# Patient Record
Sex: Female | Born: 1975 | Race: White | Hispanic: No | Marital: Married | Smoking: Former smoker
Health system: Southern US, Community
[De-identification: ages and names within clinical notes are randomized; demographics above are authoritative.]

## PROBLEM LIST (undated history)

## (undated) DIAGNOSIS — C801 Malignant (primary) neoplasm, unspecified: Secondary | ICD-10-CM

## (undated) DIAGNOSIS — T4145XA Adverse effect of unspecified anesthetic, initial encounter: Secondary | ICD-10-CM

## (undated) DIAGNOSIS — F419 Anxiety disorder, unspecified: Secondary | ICD-10-CM

## (undated) DIAGNOSIS — T8859XA Other complications of anesthesia, initial encounter: Secondary | ICD-10-CM

## (undated) DIAGNOSIS — I10 Essential (primary) hypertension: Secondary | ICD-10-CM

## (undated) HISTORY — PX: ABDOMINAL HYSTERECTOMY: SHX81

---

## 2010-10-01 HISTORY — PX: CHOLECYSTECTOMY: SHX55

## 2013-11-07 ENCOUNTER — Ambulatory Visit: Payer: Self-pay | Admitting: Family Medicine

## 2013-11-07 LAB — OCCULT BLOOD X 1 CARD TO LAB, STOOL: OCCULT BLOOD, FECES: POSITIVE

## 2015-03-07 ENCOUNTER — Ambulatory Visit
Admission: EM | Admit: 2015-03-07 | Discharge: 2015-03-07 | Disposition: A | Discharge status: Home / Self Care | Payer: BC Managed Care – PPO | Attending: Family Medicine | Admitting: Family Medicine

## 2015-03-07 DIAGNOSIS — M5442 Lumbago with sciatica, left side: Secondary | ICD-10-CM | POA: Diagnosis not present

## 2015-03-07 HISTORY — DX: Essential (primary) hypertension: I10

## 2015-03-07 HISTORY — DX: Anxiety disorder, unspecified: F41.9

## 2015-03-07 MED ORDER — KETOROLAC TROMETHAMINE 60 MG/2ML IM SOLN
60.0000 mg | Freq: Once | INTRAMUSCULAR | Status: AC
  Administered 2015-03-07: 60 mg via INTRAMUSCULAR

## 2015-03-07 MED ORDER — CYCLOBENZAPRINE HCL 10 MG PO TABS: 10.0000 mg | ORAL_TABLET | Freq: Every day | ORAL | Status: DC

## 2015-03-07 MED ORDER — HYDROCODONE-ACETAMINOPHEN 5-325 MG PO TABS: 1.0000 | ORAL_TABLET | Freq: Four times a day (QID) | ORAL | Status: DC | PRN

## 2015-03-07 NOTE — Discharge Instructions (Signed)
Back Pain, Adult °Back pain is very common. The pain often gets better over time. The cause of back pain is usually not dangerous. Most people can learn to manage their back pain on their own.  °HOME CARE  °· Stay active. Start with short walks on flat ground if you can. Try to walk farther each day. °· Do not sit, drive, or stand in one place for more than 30 minutes. Do not stay in bed. °· Do not avoid exercise or work. Activity can help your back heal faster. °· Be careful when you bend or lift an object. Bend at your knees, keep the object close to you, and do not twist. °· Sleep on a firm mattress. Lie on your side, and bend your knees. If you lie on your back, put a pillow under your knees. °· Only take medicines as told by your doctor. °· Put ice on the injured area. °¨ Put ice in a plastic bag. °¨ Place a towel between your skin and the bag. °¨ Leave the ice on for 15-20 minutes, 03-04 times a day for the first 2 to 3 days. After that, you can switch between ice and heat packs. °· Ask your doctor about back exercises or massage. °· Avoid feeling anxious or stressed. Find good ways to deal with stress, such as exercise. °GET HELP RIGHT AWAY IF:  °· Your pain does not go away with rest or medicine. °· Your pain does not go away in 1 week. °· You have new problems. °· You do not feel well. °· The pain spreads into your legs. °· You cannot control when you poop (bowel movement) or pee (urinate). °· Your arms or legs feel weak or lose feeling (numbness). °· You feel sick to your stomach (nauseous) or throw up (vomit). °· You have belly (abdominal) pain. °· You feel like you may pass out (faint). °MAKE SURE YOU:  °· Understand these instructions. °· Will watch your condition. °· Will get help right away if you are not doing well or get worse. °Document Released: 03/05/2008 Document Revised: 12/10/2011 Document Reviewed: 01/19/2014 °ExitCare® Patient Information ©2015 [employer]. This information is not intended  to replace advice given to you by your health care provider. Make sure you discuss any questions you have with your health care provider. ° °

## 2015-03-07 NOTE — ED Notes (Signed)
Since yesterday after loading dishwasher. Pt reports the pain is located in the thoracic spine radiating down to the gluteal region bilaterally, and is relieved by heat compress. Pt has also tried Ibuprofen, with no relief. Pt had an old prescription of Hydrocodone, which helped the pain moderately.

## 2015-03-08 ENCOUNTER — Encounter: Payer: Self-pay | Admitting: Physician Assistant

## 2015-03-08 NOTE — ED Provider Notes (Signed)
CSN: [id number]     Arrival date & time 03/07/15  1715 History   First MD Initiated Contact with Patient 03/07/15 1802     Chief Complaint  Patient presents with  . Back Pain   (Consider location/radiation/quality/duration/timing/severity/associated sxs/prior Treatment) Patient is a 39 y.o. female presenting with back pain.  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 Denies fall or trauma. Works at [employer]. Aching low back is common. Was helping in household yesterday and noted low back discomfort after loading the dishwasher. Took lorazepam and ibuprofen and went to bed. After a few hour nap noted continued discomfort and took another lorazepam and a "leftover 2014 hydrocodone from previous Rx, last one" . At bedtime took additional lorazepam an tylenol, now using a heating pad-spent end of day sitting on soft sofa. Walking and flat on firm surface or more comfortable. Changing position most difficult. Initially pain bilateral but localized left sided with some radiation into left buttocks. No paresthesia, no pain below buttock, no difficulty with voiding or defecating except pain rising from the toilet. Managed self care today and ambulated unassisted into suite.  Past Medical History  Diagnosis Date  . Hypertension   . Anxiety    Past Surgical History  Procedure Laterality Date  . Cholecystectomy  2012   Family History  Problem Relation Age of Onset  . Diabetes Father    History  Substance Use Topics  . Smoking status: Former Smoker -- 0.25 packs/day for 5 years    Types: Cigarettes    Quit date: 03/01/2005  . Smokeless tobacco: Not on file  . Alcohol Use: Yes     Comment: occasionally   OB History    Gravida Para Term Preterm AB TAB SAB Ectopic Multiple Living   1 1             Review of Systems  Constitutional: Negative.    HENT: Negative.   Eyes: Negative.   Respiratory: Negative for shortness of breath.   Cardiovascular: Negative for chest pain, palpitations and leg swelling.  Gastrointestinal: Negative.   Endocrine: Negative.   Genitourinary: Negative.  Negative for flank pain.  Musculoskeletal: Positive for back pain. Negative for neck pain.  Skin: Negative.   Allergic/Immunologic: Negative.   Neurological: Negative for dizziness, weakness and numbness.  Hematological: Negative.   Psychiatric/Behavioral: The patient is nervous/anxious.   All other systems reviewed and are negative.     Allergies  Lisinopril and Ciprofloxacin  Home Medications   Prior to Admission medications   Medication Sig Start Date End Date Taking? Authorizing Provider  ferrous sulfate 325 (65 FE) MG tablet Take 325 mg by mouth 2 (two) times daily with a meal.   Yes Historical Provider, MD  hydrochlorothiazide (HYDRODIURIL) 25 MG tablet Take 25 mg by mouth daily.   Yes Historical Provider, MD  LORazepam (ATIVAN) 0.5 MG tablet Take 0.5 mg by mouth every 8 (eight) hours.   Yes Historical Provider, MD  sertraline (ZOLOFT) 25 MG tablet Take 50 mg by mouth daily.   Yes Historical Provider, MD  cyclobenzaprine (FLEXERIL) 10 MG tablet Take 1 tablet (10 mg total) by mouth at bedtime. 03/07/15   Jan Fireman, PA-C  HYDROcodone-acetaminophen (NORCO/VICODIN) 5-325 MG per tablet Take 1 tablet by mouth every 6 (six) hours as needed. 03/07/15   Jan Fireman,  PA-C   BP 148/89 mmHg  Pulse 77  Temp(Src) 98.2 F (36.8 C)  Resp 16  Ht 5\' 7"  (1.702 m)  Wt 324 lb (146.965 kg)  BMI 50.73 kg/m2  SpO2 100%  LMP 02/20/2015 (Exact Date) Physical Exam Constitutional -alert and oriented,morbidy obese female reporting low back pain. Husband present Head-atraumatic Eyes- conjunctiva normal, EOMI ,conjugate gaze\ Ears -negative Nose- no congestion or rhinorrhea Mouth/throat- mucous membranes moist , Neck- supple CV- regular rate, grossly normal  heart sounds, good peripheral circulation Resp-no distress, normal respiratory effort,clear to auscultation bilaterally GI- soft,non-tender,no distention GU-  not examined Back- no tenderness along the spine or para-spinous areas; midline low lumbar low grade aching, right sciatic notch palpation can recreate right discomfort experienced yesterday- palpation of right notch is acutely uncomfortable to site and  left buttocks only. Able to climb on and off table - SLR testing is negative bilaterally. DTRs equal 2+ MSK- no lower extremity tenderness nor edema,no joint effusion, ambulatory Neuro- normal speech and language, no gross focal neurological deficit appreciated, antalgic gait Skin-warm,dry ,intact; no rash noted Psych-mood and affect grossly normal; speech and behavior grossly normal ED Course  Procedures (including critical care time) Labs Review Labs Reviewed - No data to display Medications  ketorolac (TORADOL) injection 60 mg (60 mg Intramuscular Given 03/07/15 1835)   Well tolerated and with significant relief from discomfort. Ice pack was placed on low back as she rested waiting out Rx observation, also helpful Imaging Review No results found.   1. Left-sided low back pain with left-sided sciatica     Strongly discouraged the use of outdated Rx in future. Firm support--floor or firm sofa back. Not soft mattress or soft chairs without arms. Ice pack/frozen peas Limit schedule tomorrow. Body mechanics, avoid twist/lift reviewed. Discharge Medication List as of 03/07/2015  7:14 PM    START taking these medications   Details  cyclobenzaprine (FLEXERIL) 10 MG tablet Take 1 tablet (10 mg total) by mouth at bedtime., Starting 03/07/2015, Until Discontinued, Print    HYDROcodone-acetaminophen (NORCO/VICODIN) 5-325 MG per tablet Take 1 tablet by mouth every 6 (six) hours as needed., Starting 03/07/2015, Until Discontinued, Print      Meds prescribed for HS use unless out of work and  at home- no driving-reviewed with couple. Weight issues obviously addressed before-encourage uses of back exercises given in discharge after the acute process has resolved. To RTC with any paresthesia, extension of discomfort into lower leg or elsewhere, inability to void or defecate or control those functions.  Plan: 1. diagnosis reviewed with patient and husband 2. rx as per orders; risks, benefits, potential side effects reviewed with patient 3. Recommend supportive treatment with ice paks/frozen peas,body mechanics 4. F/u prn if symptoms worsen or don't improve 5. Questions fielded, expectations and recommendations reviewed. Patient expresses understanding. Will return to [facility] with questions, concern or exacerbation.     Jan Fireman, PA-C 03/08/15 1032

## 2015-11-05 NOTE — H&P (Signed)
GYN PRE-OP VISIT  CC: surgical planning for CKC  Subjective:    Amber Rodgers is a 40 y.o. 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 She did not have a colposcopy at that time. Her PAP smears in the past have shown normal  10/2015 pap AGUS/HPV+  Patient's last menstrual period was 10/11/2015 (approximate).  Current Contraception: none Gardasil Series: no Prior cervical treatments: no treatment.  Menses - heavy with clots for 4 days/month for last 15 years - no fatigue or anemia - not interested in hormonal management  Obstetric History                      OB History  Gravida Para Term Preterm AB SAB TAB Ectopic Multiple Living  '1 1 1       1    ' # Outcome Date GA Lbr Len/2nd Weight Sex Delivery Anes PTL Lv  1 Term               Past Medical History:  has a past medical history of Anxiety, unspecified; History of pancreatitis (08/2011); and Hypertension. Problem List: has History of pancreatitis; Essential hypertension; and Generalized anxiety disorder on her problem list. Past Surgical History:  has a past surgical history that includes Cholecystectomy and Colposcopy. Family History: family history includes Anxiety disorder in her brother, mother, and son; Cervical cancer in her paternal grandmother; Diabetes type II in her father and paternal grandmother; Obesity in her father. Social History:  reports that she has never smoked. She does not have any smokeless tobacco history on file. She reports that she drinks alcohol. She reports that she does not use illicit drugs. Current Medications: has a current medication list which includes the following prescription(s): ferrous sulfate, hydrochlorothiazide, ibuprofen, lorazepam, and sertraline. Prior to encounter Medications:        Current Outpatient Prescriptions on  File Prior to Visit  Medication Sig Dispense Refill  . ferrous sulfate 325 (65 FE) MG tablet Take by mouth. Reported on 10/31/2015    . hydrochlorothiazide (HYDRODIURIL) 25 MG tablet Take 1 tablet (25 mg total) by mouth once daily. 30 tablet 11  . ibuprofen (ADVIL,MOTRIN) 400 MG tablet Take 400 mg by mouth.    Marland Kitchen LORazepam (ATIVAN) 0.5 MG tablet Take 0.5 mg by mouth once daily as needed for Anxiety.     . sertraline (ZOLOFT) 25 MG tablet TAKE 2 TABLETS(50 MG) BY MOUTH DAILY 180 tablet 3   No current facility-administered medications on file prior to visit.    Allergies: is allergic to lisinopril and ciprofloxacin.  Review of Systems 14 systems reviewed pertinent positives and negatives as noted in the HPI and below.   Objective:       Vitals:   11/04/15 1322  BP: (!) 174/93  Pulse: 102   General appearance: alert, appears stated age and cooperative Head: Normocephalic, without obvious abnormality, atraumatic  Lungs:  Clear to auscultation bilaterally  Heart:  Regular rate and rhythm, S1 and S2 normal, no murmurs, rubs, or gallop  Abdomen:  Soft, non-tender, bowel sounds active, no masses, no organomegaly  Extremities: Extremities normal, no cyanosis or edema. No deformities; no active joint inflammation.   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  CHRONIC CERVICITIS. FRAGMENTED BIOPSY SPECIMEN.  COMMENT: BASED ON INITIAL MORPHOLOGIC FINDINGS IMMUNOHISTOCHEMICAL STAINS  ARE  EVALUATED. IMMUNOHISTOCHEMICAL STAINS WERE PERFORMED ON THE BIOPSY  TISSUE "A" AND "B" USING ANTIBODIES DIRECTED AGAINST P16, AND KI67.  DIFFUSE POSITIVITY OF THE DYSPLASTIC ENDOCERVICAL GLANDS SUPPORT THE  DIAGNOSES. CONTROLS WORKING APPROPRIATELY.   Assessment:    40yo G1P1 w/ AGUS/HPV+ pap and AIS on colposcopy bx. Extensive conversation had with patient on management strategies. We discussed the next step being cold knife cone in order to obtain clean margins - if positive margins, would refer to GYN ONC for hysterectomy. If negative margins, would discuss hysterectomy with KC. We discussed course of AIS and invasive carcinoma. We discusses r/b/a of CKC. All questions were answered.  Plan:   1. AIS - consented for Cold Knife Cone of Cervix and endometrial biopsy in OR - r/b/a reviewed including bleeding/pain/infection - discussed subsequent hysterectomy after CKC - pt is done with child bearing  2. PAT - low-risk procedure - urine pregnancy test on day of surgery  I spent 30 minutes with patient counseling  Joylene Igo, MD

## 2015-11-08 ENCOUNTER — Other Ambulatory Visit: Payer: BC Managed Care – PPO

## 2015-11-10 ENCOUNTER — Ambulatory Visit: Payer: BC Managed Care – PPO | Admitting: Anesthesiology

## 2015-11-10 ENCOUNTER — Encounter
Admission: RE | Disposition: A | Discharge status: Home / Self Care | Payer: Self-pay | Source: Ambulatory Visit | Attending: Obstetrics and Gynecology

## 2015-11-10 ENCOUNTER — Ambulatory Visit
Admission: RE | Admit: 2015-11-10 | Discharge: 2015-11-10 | Disposition: A | Discharge status: Home / Self Care | Payer: BC Managed Care – PPO | Source: Ambulatory Visit | Attending: Obstetrics and Gynecology | Admitting: Obstetrics and Gynecology

## 2015-11-10 ENCOUNTER — Encounter: Payer: Self-pay | Admitting: *Deleted

## 2015-11-10 DIAGNOSIS — Z79899 Other long term (current) drug therapy: Secondary | ICD-10-CM | POA: Diagnosis not present

## 2015-11-10 DIAGNOSIS — N8500 Endometrial hyperplasia, unspecified: Secondary | ICD-10-CM | POA: Diagnosis not present

## 2015-11-10 DIAGNOSIS — Z9049 Acquired absence of other specified parts of digestive tract: Secondary | ICD-10-CM | POA: Diagnosis not present

## 2015-11-10 DIAGNOSIS — D060 Carcinoma in situ of endocervix: Secondary | ICD-10-CM | POA: Diagnosis not present

## 2015-11-10 DIAGNOSIS — F419 Anxiety disorder, unspecified: Secondary | ICD-10-CM | POA: Insufficient documentation

## 2015-11-10 DIAGNOSIS — I10 Essential (primary) hypertension: Secondary | ICD-10-CM | POA: Diagnosis not present

## 2015-11-10 DIAGNOSIS — Z833 Family history of diabetes mellitus: Secondary | ICD-10-CM | POA: Insufficient documentation

## 2015-11-10 DIAGNOSIS — Z8049 Family history of malignant neoplasm of other genital organs: Secondary | ICD-10-CM | POA: Diagnosis not present

## 2015-11-10 DIAGNOSIS — Z8719 Personal history of other diseases of the digestive system: Secondary | ICD-10-CM | POA: Insufficient documentation

## 2015-11-10 DIAGNOSIS — Z791 Long term (current) use of non-steroidal anti-inflammatories (NSAID): Secondary | ICD-10-CM | POA: Diagnosis not present

## 2015-11-10 DIAGNOSIS — Z818 Family history of other mental and behavioral disorders: Secondary | ICD-10-CM | POA: Diagnosis not present

## 2015-11-10 HISTORY — DX: Adverse effect of unspecified anesthetic, initial encounter: T41.45XA

## 2015-11-10 HISTORY — DX: Other complications of anesthesia, initial encounter: T88.59XA

## 2015-11-10 HISTORY — PX: CERVICAL CONIZATION W/BX: SHX1330

## 2015-11-10 LAB — POCT PREGNANCY, URINE: Preg Test, Ur: NEGATIVE

## 2015-11-10 SURGERY — CONE BIOPSY, CERVIX
Anesthesia: General | Wound class: Clean Contaminated

## 2015-11-10 MED ORDER — FENTANYL CITRATE (PF) 100 MCG/2ML IJ SOLN
INTRAMUSCULAR | Status: DC | PRN
  Administered 2015-11-10: 100 ug via INTRAVENOUS

## 2015-11-10 MED ORDER — LIDOCAINE-EPINEPHRINE 1 %-1:100000 IJ SOLN
INTRAMUSCULAR | Status: AC
  Filled 2015-11-10: qty 1

## 2015-11-10 MED ORDER — LIDOCAINE HCL (CARDIAC) 20 MG/ML IV SOLN
INTRAVENOUS | Status: DC | PRN
  Administered 2015-11-10: 60 mg via INTRAVENOUS

## 2015-11-10 MED ORDER — KETOROLAC TROMETHAMINE 30 MG/ML IJ SOLN
INTRAMUSCULAR | Status: DC | PRN
  Administered 2015-11-10: 30 mg via INTRAVENOUS

## 2015-11-10 MED ORDER — FENTANYL CITRATE (PF) 100 MCG/2ML IJ SOLN: 25.0000 ug | INTRAMUSCULAR | Status: DC | PRN

## 2015-11-10 MED ORDER — OXYCODONE HCL 5 MG PO TABS: 5.0000 mg | ORAL_TABLET | Freq: Once | ORAL | Status: DC | PRN

## 2015-11-10 MED ORDER — FERRIC SUBSULFATE 259 MG/GM EX SOLN
CUTANEOUS | Status: AC
  Filled 2015-11-10: qty 8

## 2015-11-10 MED ORDER — ONDANSETRON 4 MG PO TBDP: 4.0000 mg | ORAL_TABLET | Freq: Four times a day (QID) | ORAL | Status: DC | PRN

## 2015-11-10 MED ORDER — ACETAMINOPHEN 325 MG PO TABS: 650.0000 mg | ORAL_TABLET | Freq: Four times a day (QID) | ORAL | Status: DC | PRN

## 2015-11-10 MED ORDER — LIDOCAINE-EPINEPHRINE 1 %-1:100000 IJ SOLN
INTRAMUSCULAR | Status: DC | PRN
  Administered 2015-11-10: 20 mL

## 2015-11-10 MED ORDER — LACTATED RINGERS IV SOLN
INTRAVENOUS | Status: DC
  Administered 2015-11-10: 12:00:00 via INTRAVENOUS

## 2015-11-10 MED ORDER — PROMETHAZINE HCL 25 MG/ML IJ SOLN
6.2500 mg | INTRAMUSCULAR | Status: DC | PRN
  Administered 2015-11-10: 12.5 mg via INTRAVENOUS

## 2015-11-10 MED ORDER — ACETAMINOPHEN 650 MG RE SUPP: 650.0000 mg | Freq: Four times a day (QID) | RECTAL | Status: DC | PRN

## 2015-11-10 MED ORDER — PROMETHAZINE HCL 25 MG/ML IJ SOLN
INTRAMUSCULAR | Status: AC
  Administered 2015-11-10: 12.5 mg via INTRAVENOUS
  Filled 2015-11-10: qty 1

## 2015-11-10 MED ORDER — PROPOFOL 10 MG/ML IV BOLUS
INTRAVENOUS | Status: DC | PRN
  Administered 2015-11-10: 200 mg via INTRAVENOUS
  Administered 2015-11-10: 60 mg via INTRAVENOUS

## 2015-11-10 MED ORDER — KETOROLAC TROMETHAMINE 15 MG/ML IJ SOLN: 15.0000 mg | Freq: Four times a day (QID) | INTRAMUSCULAR | Status: DC

## 2015-11-10 MED ORDER — DEXAMETHASONE SODIUM PHOSPHATE 10 MG/ML IJ SOLN
INTRAMUSCULAR | Status: DC | PRN
  Administered 2015-11-10: 5 mg via INTRAVENOUS

## 2015-11-10 MED ORDER — LACTATED RINGERS IV SOLN: INTRAVENOUS | Status: DC

## 2015-11-10 MED ORDER — OXYCODONE HCL 5 MG PO TABS: 5.0000 mg | ORAL_TABLET | ORAL | Status: DC | PRN

## 2015-11-10 MED ORDER — ROCURONIUM BROMIDE 100 MG/10ML IV SOLN
INTRAVENOUS | Status: DC | PRN
  Administered 2015-11-10: 10 mg via INTRAVENOUS

## 2015-11-10 MED ORDER — MIDAZOLAM HCL 5 MG/5ML IJ SOLN
INTRAMUSCULAR | Status: DC | PRN
  Administered 2015-11-10: 2 mg via INTRAVENOUS

## 2015-11-10 MED ORDER — GELATIN ABSORBABLE 12-7 MM EX MISC
CUTANEOUS | Status: AC
  Filled 2015-11-10: qty 1

## 2015-11-10 MED ORDER — DIPHENHYDRAMINE HCL 12.5 MG/5ML PO ELIX: 12.5000 mg | ORAL_SOLUTION | Freq: Four times a day (QID) | ORAL | Status: DC | PRN

## 2015-11-10 MED ORDER — ONDANSETRON HCL 4 MG/2ML IJ SOLN
INTRAMUSCULAR | Status: DC | PRN
  Administered 2015-11-10: 4 mg via INTRAVENOUS

## 2015-11-10 MED ORDER — IODINE STRONG (LUGOLS) 5 % PO SOLN
ORAL | Status: AC
  Filled 2015-11-10: qty 1

## 2015-11-10 MED ORDER — FAMOTIDINE 20 MG PO TABS
20.0000 mg | ORAL_TABLET | Freq: Once | ORAL | Status: AC
  Administered 2015-11-10: 20 mg via ORAL

## 2015-11-10 MED ORDER — DIPHENHYDRAMINE HCL 50 MG/ML IJ SOLN: 12.5000 mg | Freq: Four times a day (QID) | INTRAMUSCULAR | Status: DC | PRN

## 2015-11-10 MED ORDER — OXYCODONE HCL 5 MG/5ML PO SOLN: 5.0000 mg | Freq: Once | ORAL | Status: DC | PRN

## 2015-11-10 MED ORDER — ONDANSETRON HCL 4 MG/2ML IJ SOLN: 4.0000 mg | Freq: Four times a day (QID) | INTRAMUSCULAR | Status: DC | PRN

## 2015-11-10 MED ORDER — FAMOTIDINE 20 MG PO TABS
ORAL_TABLET | ORAL | Status: AC
  Administered 2015-11-10: 20 mg via ORAL
  Filled 2015-11-10: qty 1

## 2015-11-10 MED ORDER — KETOROLAC TROMETHAMINE 15 MG/ML IJ SOLN: 15.0000 mg | Freq: Four times a day (QID) | INTRAMUSCULAR | Status: DC | PRN

## 2015-11-10 MED ORDER — SODIUM CHLORIDE 0.9 % IJ SOLN
INTRAMUSCULAR | Status: AC
  Filled 2015-11-10: qty 10

## 2015-11-10 MED ORDER — SUCCINYLCHOLINE CHLORIDE 20 MG/ML IJ SOLN
INTRAMUSCULAR | Status: DC | PRN
  Administered 2015-11-10: 120 mg via INTRAVENOUS

## 2015-11-10 SURGICAL SUPPLY — 25 items
APPLICATOR COTTON TIP 6IN STRL (MISCELLANEOUS) ×12 IMPLANT
BLADE SURG SZ11 CARB STEEL (BLADE) ×3 IMPLANT
CANISTER SUCT 1200ML W/VALVE (MISCELLANEOUS) ×3 IMPLANT
CATH ROBINSON RED A/P 16FR (CATHETERS) ×3 IMPLANT
CNTNR SPEC 2.5X3XGRAD LEK (MISCELLANEOUS) ×1
CONT SPEC 4OZ STER OR WHT (MISCELLANEOUS) ×2
CONTAINER SPEC 2.5X3XGRAD LEK (MISCELLANEOUS) ×1 IMPLANT
DRESSING TELFA 4X3 1S ST N-ADH (GAUZE/BANDAGES/DRESSINGS) ×3 IMPLANT
ELECT REM PT RETURN 9FT ADLT (ELECTROSURGICAL) ×3
ELECTRODE REM PT RTRN 9FT ADLT (ELECTROSURGICAL) ×1 IMPLANT
GLOVE BIO SURGEON STRL SZ8 (GLOVE) ×3 IMPLANT
GOWN STRL REUS W/ TWL LRG LVL3 (GOWN DISPOSABLE) ×1 IMPLANT
GOWN STRL REUS W/ TWL XL LVL3 (GOWN DISPOSABLE) ×1 IMPLANT
GOWN STRL REUS W/TWL LRG LVL3 (GOWN DISPOSABLE) ×2
GOWN STRL REUS W/TWL XL LVL3 (GOWN DISPOSABLE) ×2
KIT RM TURNOVER CYSTO AR (KITS) ×3 IMPLANT
NS IRRIG 500ML POUR BTL (IV SOLUTION) ×3 IMPLANT
PACK BASIN MINOR ARMC (MISCELLANEOUS) ×3 IMPLANT
PACK DNC HYST (MISCELLANEOUS) ×3 IMPLANT
PAD OB MATERNITY 4.3X12.25 (PERSONAL CARE ITEMS) ×3 IMPLANT
PAD PREP 24X41 OB/GYN DISP (PERSONAL CARE ITEMS) ×3 IMPLANT
SUT CHROMIC 1-0 (SUTURE) ×15 IMPLANT
SUT VIC AB 0 CT1 18XCR BRD 8 (SUTURE) ×1 IMPLANT
SUT VIC AB 0 CT1 8-18 (SUTURE) ×2
SYRINGE 10CC LL (SYRINGE) ×3 IMPLANT

## 2015-11-10 NOTE — Anesthesia Preprocedure Evaluation (Signed)
Anesthesia Evaluation  Patient identified by MRN, date of birth, ID band Patient awake    Reviewed: Allergy & Precautions, H&P , NPO status , Patient's Chart, lab work & pertinent test results  History of Anesthesia Complications (+) PONV and history of anesthetic complications  Airway Mallampati: III  TM Distance: >3 FB Neck ROM: full    Dental  (+) Poor Dentition, Chipped   Pulmonary neg shortness of breath, former smoker,    Pulmonary exam normal breath sounds clear to auscultation       Cardiovascular Exercise Tolerance: Good hypertension, (-) angina(-) Past MI and (-) DOE Normal cardiovascular exam Rhythm:regular Rate:Normal     Neuro/Psych PSYCHIATRIC DISORDERS Anxiety negative neurological ROS     GI/Hepatic Neg liver ROS, neg GERD  ,  Endo/Other  Morbid obesity  Renal/GU negative Renal ROS  negative genitourinary   Musculoskeletal   Abdominal   Peds  Hematology negative hematology ROS (+)   Anesthesia Other Findings Past Medical History:   Hypertension                                                 Anxiety                                                      Complication of anesthesia                                     Comment:Nausea/ Vomiting  Past Surgical History:   CHOLECYSTECTOMY                                  2012        BMI    Body Mass Index   49.95 kg/m 2      Reproductive/Obstetrics negative OB ROS                             Anesthesia Physical Anesthesia Plan  ASA: III  Anesthesia Plan: General ETT   Post-op Pain Management:    Induction:   Airway Management Planned:   Additional Equipment:   Intra-op Plan:   Post-operative Plan:   Informed Consent: I have reviewed the patients History and Physical, chart, labs and discussed the procedure including the risks, benefits and alternatives for the proposed anesthesia with the patient or  authorized representative who has indicated his/her understanding and acceptance.   Dental Advisory Given  Plan Discussed with: Anesthesiologist, CRNA and Surgeon  Anesthesia Plan Comments:         Anesthesia Quick Evaluation

## 2015-11-10 NOTE — Op Note (Signed)
Operative Report Hysteroscopy with Dilation and Curettage; Cold Knife Cone   Indications: Adenocarcinoma in-situ  Pre-operative Diagnosis: Adenocarcinoma in-situ, AGUS pap  Post-operative Diagnosis: same.  Procedure: 1. Exam under anesthesia 2. Cone Knife Cone of Cervix 3. D&C  Surgeon: Joylene Igo, MD  Assistant(s):  None  Anesthesia: General endotracheal anesthesia  Estimated Blood Loss:  less than 50 mL         Intraoperative medications: none  Urine Output: 4ml         Specimens: Endocervical curettings, endometrial curettings, Cone of cervix         Complications:  None; patient tolerated the procedure well.         Disposition: PACU - hemodynamically stable.         Condition: stable  Findings: Uterus measuring 7 cm by sound; normal cervix, vagina, perineum. No nodules palpated on thorough vaginal exam. Rectovaginal septum normal.  Difficult visualization due to redundant vaginal tissue and posterior prolapse  Indication for procedure/Consents: 40yo G1P1 here for scheduled surgery for the aforementioned diagnoses. Risks of surgery were discussed with the patient including but not limited to: bleeding which may require transfusion; infection which may require antibiotics; injury to uterus or surrounding organs; intrauterine scarring which may impair future fertility; need for additional procedures including laparotomy or laparoscopy; and other postoperative/anesthesia complications. Written informed consent was obtained.    Procedure Details:   The patient was taken to the operating room where general anesthesia was administered and was found to be adequate. After a formal and adequate timeout was performed, she was placed in the dorsal lithotomy position and examined with the above findings. She was then prepped and draped in the sterile manner. Her bladder was catheterized for an estimated amount of clear, yellow urine. A speculum was then placed in the  patient's vagina and a single tooth tenaculum was applied to the anterior lip of the cervix and posterior lip of cervix.   18ml of 1% lidocaine with epinephrine 1:1 was injected into the cervical stroma. Stay sutures were placed with 0-chromic at 3 and 9 o'clock positions. An 11 blade was used to cut the cone specimen and the specimen was tagged with a suture at 12 o'clock. Sutures were placed across the surface of the surgical bed with 0-vicryl pop-off sutures, everting the cervical os. Bovie cautery with rollerball was used to ensure hemostasis at the surgical bed. ECC was performed as well as dilation and currettage.   The patient tolerated the procedure well and was taken to the recovery area awake and in stable condition. She received iv Toradol prior to leaving the OR.  The patient will be discharged to home as per PACU criteria. Routine postoperative instructions given.She will follow up in the clinic in four weeks for postoperative evaluation.  [name], MD

## 2015-11-10 NOTE — Anesthesia Procedure Notes (Signed)
Procedure Name: Intubation Date/Time: 11/10/2015 12:20 PM Performed by: Dionne Bucy Pre-anesthesia Checklist: Patient identified, Patient being monitored, Timeout performed, Emergency Drugs available and Suction available Patient Re-evaluated:Patient Re-evaluated prior to inductionOxygen Delivery Method: Circle system utilized Preoxygenation: Pre-oxygenation with 100% oxygen Intubation Type: IV induction Ventilation: Mask ventilation without difficulty Laryngoscope Size: Mac and 3 Grade View: Grade I Tube type: Oral Tube size: 7.0 mm Number of attempts: 1 Airway Equipment and Method: Stylet and Patient positioned with wedge pillow Placement Confirmation: ETT inserted through vocal cords under direct vision,  positive ETCO2 and breath sounds checked- equal and bilateral Secured at: 21 cm Tube secured with: Tape Dental Injury: Teeth and Oropharynx as per pre-operative assessment

## 2015-11-10 NOTE — Discharge Instructions (Signed)
Conization of the Cervix, Care After Refer to this sheet in the next few weeks. These instructions provide you with information on caring for yourself after your procedure. Your health care provider may also give you more specific instructions. Your treatment has been planned according to current medical practices but problems sometimes occur. Call your health care provider if you have any problems or questions after your procedure. WHAT TO EXPECT AFTER THE PROCEDURE After your procedure, it is typical to have the following sensations:  If you had a general anesthetic, you may be groggy for 2-3 hours after the procedure.  You may have cramps (similar to menstrual cramps) for about 1 week.   You may have a bloody discharge or light to moderate bleeding for 1-2 weeks. The bleeding should not be heavy (for example, it should not soak 1 pad in less than 1 hour).  You may have a black vaginal discharge that looks similar to coffee grounds. This is from the paste that was applied to the cervix to control bleeding. This is normal. Recovery may take up to 3 weeks.  HOME CARE INSTRUCTIONS   Arrange for someone to drive you home after the procedure.  Only take medicines as directed by your health care provider. Do not take aspirin. It can cause bleeding.   Take showers for the first week. Do not take baths, swim, or use hot tubs until your health care provider says it is okay.   Do not douche, use tampons, or have sexual intercourse until your health care provider says it is okay.   Avoid strenuous activities, exercises, and heavy lifting for at least 7-14 days.  You may resume your normal diet unless your health care provider advises you differently.    If you are constipated, you may:  Take a mild laxative as directed by your health care provider.   Add fruit and bran to your diet.   Make sure to drink enough fluids to keep your urine clear or pale yellow.  Keep follow-up  appointments with your health care provider. SEEK MEDICAL CARE IF:   You develop a rash.   You are dizzy or lightheaded.   You feel nauseous.   You develop a bad smelling vaginal discharge. SEEK IMMEDIATE MEDICAL CARE IF:   You have blood clots or bleeding that is heavier than a normal menstrual period (for example, soaking a pad in less than 1 hour) or you develop bright red bleeding.   You have a fever over 101F (38.3C) or persistent symptoms for more than 2-3 days.   You have a fever over 101F (38.3C) and your symptoms suddenly get worse.  You have increasing cramps.   You faint.   You have pain when urinating.  You have bloody urine.   You start vomiting.   Your pain is not relieved with your medicine.   Your have severe or worsening pain. MAKE SURE YOU:  Understand these instructions.  Will watch your condition.  Will get help right away if you are not doing well or get worse.   This information is not intended to replace advice given to you by your health care provider. Make sure you discuss any questions you have with your health care provider.   Document Released: 09/17/2005 Document Revised: 09/22/2013 Document Reviewed: 03/13/2013 Elsevier Interactive Patient Education 2016 [place]   1) The drugs that you were given will stay in your system until tomorrow so for the next 24  hours you should not:  A) Drive an automobile B) Make any legal decisions C) Drink any alcoholic beverage   2) You may resume regular meals tomorrow.  Today it is better to start with liquids and gradually work up to solid foods.  You may eat anything you prefer, but it is better to start with liquids, then soup and crackers, and gradually work up to solid foods.   3) Please notify your doctor immediately if you have any unusual bleeding, trouble breathing, redness and pain at the surgery site, drainage, fever, or  pain not relieved by medication.    4) Additional Instructions:        Please contact your physician with any problems or Same Day Surgery at [phone], Monday through Friday 6 am to 4 pm, or [place] at [facility] number at [phone].

## 2015-11-10 NOTE — Interval H&P Note (Signed)
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 After consideration of risks, benefits and other options for treatment, the patient has consented to  Procedure(s): CONIZATION CERVIX WITH BIOPSY (N/A) as a surgical intervention .  The patient's history has been reviewed, patient examined, no change in status, stable for surgery.  I have reviewed the patient's chart and labs.  Questions were answered to the patient's satisfaction.     [place]

## 2015-11-10 NOTE — Transfer of Care (Signed)
Immediate Anesthesia Transfer of Care Note  Patient: Amber Rodgers  Procedure(s) Performed: Procedure(s): cold knife cone,endometrial biopsy (N/A)  Patient Location: PACU  Anesthesia Type:General  Level of Consciousness: awake and patient cooperative  Airway & Oxygen Therapy: Patient Spontanous Breathing and Patient connected to face mask oxygen  Post-op Assessment: Report given to RN  Post vital signs: Reviewed and stable  Last Vitals:  Filed Vitals:   11/10/15 1125 11/10/15 1350  BP: 187/106 148/77  Pulse: 83 85  Temp: 37.2 C 36.3 C  Resp: 18 14    Complications: No apparent anesthesia complications

## 2015-11-10 NOTE — Anesthesia Postprocedure Evaluation (Signed)
Anesthesia Post Note  Patient: Amber Rodgers  Procedure(s) Performed: Procedure(s) (LRB): cold knife cone,endometrial biopsy (N/A)  Patient location during evaluation: PACU Anesthesia Type: General Level of consciousness: awake and alert Pain management: pain level controlled Vital Signs Assessment: post-procedure vital signs reviewed and stable Respiratory status: spontaneous breathing, nonlabored ventilation, respiratory function stable and patient connected to nasal cannula oxygen Cardiovascular status: blood pressure returned to baseline and stable Postop Assessment: no signs of nausea or vomiting Anesthetic complications: no    Last Vitals:  Filed Vitals:   11/10/15 1450 11/10/15 1524  BP: 149/80 146/80  Pulse: 73 65  Temp: 36.2 C   Resp: 16 16    Last Pain:  Filed Vitals:   11/10/15 1525  PainSc: 3                  Precious Haws Chaden Doom

## 2015-11-14 ENCOUNTER — Encounter: Payer: Self-pay | Admitting: Obstetrics and Gynecology

## 2015-11-14 LAB — SURGICAL PATHOLOGY

## 2016-02-19 NOTE — H&P (Signed)
GYN RETURN PATIENT VISIT  CC: TLH/BS/CYSTO preop  Subjective:    Amber Rodgers is a 40 y.o. 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 Denies abnormal vaginal discharge, pelvic pain.   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  She did not have a colposcopy at that time. Her PAP smears in the past have shown normal  10/2015 pap AGUS/HPV+ CKC w/ adenocarcinoma in situ, negative but close margins  Patient's last menstrual period was 02/15/2016 (exact date).   Current Contraception: none Gardasil Series: no Prior cervical treatments: no treatment.  Menses - heavy with clots for 4 days/month for last 15 years - no fatigue or anemia - not interested in hormonal management  Obstetric History                      OB History  Gravida Para Term Preterm AB SAB TAB Ectopic Multiple Living  1 1 1       1     # Outcome Date GA Lbr Len/2nd Weight Sex Delivery Anes PTL Lv  1 Term               Past Medical History:  has a past medical history of Anxiety, unspecified; History of pancreatitis (08/2011); and Hypertension. Problem List: has History of pancreatitis; Essential hypertension; Generalized anxiety disorder; and Adenocarcinoma in situ of cervix on her problem list. Past Surgical History:  has a past surgical history that includes Cholecystectomy; Colposcopy; and Hysteroscopy with D&C (11/10/2015). Family History: family history includes Anxiety disorder in her brother, mother, and son; Cervical cancer in her paternal grandmother; Diabetes type II in her father and paternal grandmother; Obesity in her father. Social History:  reports that she has never smoked. She has never used smokeless tobacco. She reports that she drinks alcohol. She reports that she does not use illicit drugs. Current Medications: has a current medication list  which includes the following prescription(s): acetaminophen, cholecalciferol, cyanocobalamin, ferrous sulfate, hydrochlorothiazide, lorazepam, melatonin, sertraline, and ibuprofen. Prior to encounter Medications:        Current Outpatient Prescriptions on File Prior to Visit  Medication Sig Dispense Refill  . cholecalciferol (VITAMIN D3) 400 unit chewable tablet Take 400 Units by mouth once daily.    . cyanocobalamin (VITAMIN B12) 1000 MCG tablet Take 1,000 mcg by mouth once daily.    . ferrous sulfate 325 (65 FE) MG tablet Take by mouth. Reported on 10/31/2015    . hydrochlorothiazide (HYDRODIURIL) 25 MG tablet Take 1 tablet (25 mg total) by mouth once daily. 30 tablet 11  . LORazepam (ATIVAN) 0.5 MG tablet Take 0.5 mg by mouth once daily as needed for Anxiety.     . melatonin 1 mg tablet Take 0.5 mg by mouth nightly.    . sertraline (ZOLOFT) 25 MG tablet TAKE 2 TABLETS(50 MG) BY MOUTH DAILY 180 tablet 3  . ibuprofen (ADVIL,MOTRIN) 400 MG tablet Take 400 mg by mouth. Reported on 02/15/2016     No current facility-administered medications on file prior to visit.    Allergies: is allergic to lisinopril and ciprofloxacin.  Review of Systems 14 systems reviewed pertinent positives and negatives as noted in the HPI and below.   Objective:       Vitals:   02/15/16 1326  BP: 126/78  Pulse: 93   General appearance: alert, appears stated age and cooperative Head: Normocephalic,  without obvious abnormality, atraumatic  Lungs: clear to auscultation bilaterally Heart: regular rate and rhythm, S1, S2 normal, no murmur, click, rub or gallop    Path:  DIAGNOSIS: A. CERVIX; COLD KNIFE CONE: - ADENOCARCINOMA IN SITU OF THE ENDOCERVIX. - NO DEFINITE INVASION. - THE ENDOCERVICAL MARGIN OF EXCISION IS CLOSE BUT NEGATIVE.  B. ENDOCERVIX; CURRETAGE: - FRAGMENTS OF BENIGN ENDOCERVICAL AND ENDOMETRIAL GLANDULAR TISSUE.  C. ENDOMETRIUM; CURETTAGE: - PROLIFERATIVE  ENDOMETRIUM. - NEGATIVE FOR HYPERPLASIA AND CARCINOMA.  TVUS 3/23 Ut wnl bil ovs wnl No adnexal masses seen Assessment:    40yo G1P1 w/ adenocarcinoma in-situ of the cervix, CKC with negative margins. As patient is finished child bearing, we will continue with definitive hysterectomy. There is a 1-3% chance that there are satellite lesions remaining in uterus/cervix. Extensive counseling done with patient on r/b/a of TLH/BS/cystoscopy.  Plan:   1. AIS - plan for TLH/BS/cystoscopy - signed consent forms last visit - cont diet and exercise - risks include bleeding, pain, infection, wound infection, cuff dehiscence, bowel/bladder injury again reviewed - pyridium 200mg  in preop area    Joylene Igo, MD

## 2016-02-20 ENCOUNTER — Other Ambulatory Visit: Payer: BC Managed Care – PPO

## 2016-02-20 ENCOUNTER — Encounter: Payer: Self-pay | Admitting: *Deleted

## 2016-02-20 NOTE — Patient Instructions (Signed)
  Your procedure is scheduled on: 03-02-16 (FRIDAY) Report to [facility] To find out your arrival time please call [phone] between 1PM - 3PM on 03-01-16 (THURSDAY)  Remember: Instructions that are not followed completely may result in serious medical risk, up to and including death, or upon the discretion of your surgeon and anesthesiologist your surgery may need to be rescheduled.    _X__ 1. Do not eat food or drink liquids after midnight. No gum chewing or hard candies.     _X__ 2. No Alcohol for 24 hours before or after surgery.   ____ 3. Bring all medications with you on the day of surgery if instructed.    _X__ 4. Notify your doctor if there is any change in your medical condition     (cold, fever, infections).     Do not wear jewelry, make-up, hairpins, clips or nail polish.  Do not wear lotions, powders, or perfumes. You may wear deodorant.  Do not shave 48 hours prior to surgery. Men may shave face and neck.  Do not bring valuables to the hospital.    [facility] is not responsible for any belongings or valuables.               Contacts, dentures or bridgework may not be worn into surgery.  Leave your suitcase in the car. After surgery it may be brought to your room.  For patients admitted to the hospital, discharge time is determined by your reatment team.   Patients discharged the day of surgery will not be allowed to drive home.   Please read over the following fact sheets that you were given:      ____ Take these medicines the morning of surgery with A SIP OF WATER:    1.MAY TAKE ATIVAN IF NEEDED DAY OF SURGERY WITH A SMALL SIP OF WATER  2.   3.   4.  5.  6.  ____ Fleet Enema (as directed)   _X__ Use CHG Soap as directed  ____ Use inhalers on the day of surgery  ____ Stop metformin 2 days prior to surgery    ____ Take 1/2 of usual insulin dose the night before surgery and none on the morning of surgery.   ____ Stop  Coumadin/Plavix/aspirin-N/A  _X__ Stop Anti-inflammatories-NO NSAIDS OR ASPIRIN PRODUCTS 7 DAYS PRIOR TO SURGERY-TYLENOL OK TO TAKE   _X__ Stop supplements until after surgery-STOP MELATONIN 7 DAYS PRIOR TO SURGERY  ____ Bring C-Pap to the hospital.

## 2016-02-21 ENCOUNTER — Encounter
Admission: RE | Admit: 2016-02-21 | Discharge: 2016-02-21 | Disposition: A | Discharge status: Home / Self Care | Payer: BC Managed Care – PPO | Source: Ambulatory Visit | Attending: Obstetrics and Gynecology | Admitting: Obstetrics and Gynecology

## 2016-02-21 DIAGNOSIS — Z01810 Encounter for preprocedural cardiovascular examination: Secondary | ICD-10-CM | POA: Diagnosis present

## 2016-02-21 DIAGNOSIS — Z01812 Encounter for preprocedural laboratory examination: Secondary | ICD-10-CM | POA: Insufficient documentation

## 2016-02-21 LAB — CBC
HEMATOCRIT: 36.8 % (ref 35.0–47.0)
Hemoglobin: 12.6 g/dL (ref 12.0–16.0)
MCH: 28.5 pg (ref 26.0–34.0)
MCHC: 34.1 g/dL (ref 32.0–36.0)
MCV: 83.6 fL (ref 80.0–100.0)
PLATELETS: 283 10*3/uL (ref 150–440)
RBC: 4.4 MIL/uL (ref 3.80–5.20)
RDW: 13.4 % (ref 11.5–14.5)
WBC: 6.5 10*3/uL (ref 3.6–11.0)

## 2016-02-21 LAB — TYPE AND SCREEN
ABO/RH(D): O NEG
Antibody Screen: NEGATIVE

## 2016-02-21 LAB — POTASSIUM: POTASSIUM: 3.3 mmol/L — AB (ref 3.5–5.1)

## 2016-02-21 LAB — ABO/RH: ABO/RH(D): O NEG

## 2016-02-21 NOTE — Pre-Procedure Instructions (Signed)
CALLED DR ADAMS REGARDING ABNORMAL EKG-NONE FOR COMPARISON-MEDICAL CLEARANCE NEEDED PER DR ADAMS.  CALLED DR HALFONS OFFICE TO INFORM THEM OF THIS. ALSO FAXED CLEARANCE REQUEST ALONG WITH EKG TO THEIR OFFICE WITH FAX CONFIRMATION RECEIVED. ALSO INFORMED THEM THAT PTS POTASSIUM WAS LOW AND FAXED THOSE RESULTS ALSO.  I CALLED PTS PCP OFFICE DR Bobette Mo AT [facility] TO INFORM THEM OF THIS. I SPOKE WITH DARLENE AND SHE SAID SHE WOULD CALL PT IN IF PT NEEDED TO COME IN. I ALSO FAXED MED CLEARANCE REQUEST AND EKG  TO THEIR OFFICE AND RECEIVED FAX CONFIRMATION THAT IT WENT THRU

## 2016-02-29 NOTE — Pre-Procedure Instructions (Signed)
CALLED [facility] TO CHECK ON STATUS OF MEDICAL CLEARANCE-RECEPTIONIST SAID THAT PT IS COMING IN TODAY FOR HER MEDICAL CLEARANCE APPOINTMENT WITH DR Bobette Mo.

## 2016-03-01 NOTE — Pre-Procedure Instructions (Signed)
Received medical clearance note from Dr Bobette Mo regarding abnormal EKG-LOW RISK (on chart) and in [facility]

## 2016-03-01 NOTE — Pre-Procedure Instructions (Signed)
Patient Instructions - Gearldine Shown, DO - 02/29/2016 1:00 PM EDT  You have been cleared for surgery. Please take potassium once daily for the next 2 days prior to surgery.  Ordered Prescriptions - in this encounter Prescription Sig. Disp. Refills Start Date End Date  potassium chloride (K-DUR,KLOR-CON) 20 MEQ ER tablet  Indications: Hypokalemia Take 1 tablet (20 mEq total) by mouth once daily for 2 days. 2 tablet  0 02/29/2016 03/02/2016  Plan of Treatment - as of this encounter Upcoming Encounters Upcoming Encounters  Date Type Specialty Care Team Description  04/30/2016 Office Visit Family Medicine Gearldine Shown, [address]  [place], [place] [address]  [phone]  [phone] (Fax)    ECG Results - in this encounter   ECG 12-lead (02/29/2016 12:03 PM) ECG 12-lead (02/29/2016 12:03 PM)  Component Value Ref Range  Vent Rate (bpm) 83   PR Interval (msec) 174   QRS Interval (msec) 88   QT Interval (msec) 392   QTc (msec) 460    ECG 12-lead (02/29/2016 12:03 PM)  Specimen Performing Laboratory   [facility] GE MUSE RESULTS    ECG 12-lead (02/29/2016 12:03 PM)  Narrative  Normal sinus rhythm  Left atrial enlargement  Otherwise normal ECG    No previous ECGs available  I reviewed and concur with this report. Electronically signed [id number], MD, AUGUSTUS ([id number]) on 02/29/2016 5:19:26 PM   Visit Diagnoses - in this encounter Diagnosis  Preoperative cardiovascular examination - Primary  Pre-operative cardiovascular examination   History of abnormal electrocardiogram  Essential hypertension  Hypokalemia  Hypopotassemia   Document Information Service Providers Document Coverage Dates May. 31, 2017 - Jun. 01, 2017 [place] [phone] (Work) [place], [place] [address] Encounter Providers Titus Mould Santayana DO (Attending) [phone] (Work) [phone] (Fax)  [address]  [place], [place] [address]

## 2016-03-02 ENCOUNTER — Observation Stay
Admission: RE | Admit: 2016-03-02 | Discharge: 2016-03-03 | Disposition: A | Discharge status: Home / Self Care | Payer: BC Managed Care – PPO | Source: Ambulatory Visit | Attending: Obstetrics and Gynecology | Admitting: Obstetrics and Gynecology

## 2016-03-02 ENCOUNTER — Encounter: Payer: Self-pay | Admitting: *Deleted

## 2016-03-02 ENCOUNTER — Ambulatory Visit: Payer: BC Managed Care – PPO | Admitting: Certified Registered Nurse Anesthetist

## 2016-03-02 ENCOUNTER — Encounter
Admission: RE | Disposition: A | Discharge status: Home / Self Care | Payer: Self-pay | Source: Ambulatory Visit | Attending: Obstetrics and Gynecology

## 2016-03-02 DIAGNOSIS — Z79899 Other long term (current) drug therapy: Secondary | ICD-10-CM | POA: Insufficient documentation

## 2016-03-02 DIAGNOSIS — I10 Essential (primary) hypertension: Secondary | ICD-10-CM | POA: Insufficient documentation

## 2016-03-02 DIAGNOSIS — F419 Anxiety disorder, unspecified: Secondary | ICD-10-CM | POA: Insufficient documentation

## 2016-03-02 DIAGNOSIS — Z833 Family history of diabetes mellitus: Secondary | ICD-10-CM | POA: Insufficient documentation

## 2016-03-02 DIAGNOSIS — Z791 Long term (current) use of non-steroidal anti-inflammatories (NSAID): Secondary | ICD-10-CM | POA: Diagnosis not present

## 2016-03-02 DIAGNOSIS — Z818 Family history of other mental and behavioral disorders: Secondary | ICD-10-CM | POA: Insufficient documentation

## 2016-03-02 DIAGNOSIS — Z8489 Family history of other specified conditions: Secondary | ICD-10-CM | POA: Diagnosis not present

## 2016-03-02 DIAGNOSIS — D069 Carcinoma in situ of cervix, unspecified: Secondary | ICD-10-CM | POA: Diagnosis present

## 2016-03-02 DIAGNOSIS — Z9049 Acquired absence of other specified parts of digestive tract: Secondary | ICD-10-CM | POA: Insufficient documentation

## 2016-03-02 DIAGNOSIS — Z90710 Acquired absence of both cervix and uterus: Secondary | ICD-10-CM | POA: Diagnosis present

## 2016-03-02 DIAGNOSIS — D259 Leiomyoma of uterus, unspecified: Secondary | ICD-10-CM | POA: Diagnosis not present

## 2016-03-02 DIAGNOSIS — D225 Melanocytic nevi of trunk: Secondary | ICD-10-CM | POA: Diagnosis not present

## 2016-03-02 DIAGNOSIS — Z8719 Personal history of other diseases of the digestive system: Secondary | ICD-10-CM | POA: Insufficient documentation

## 2016-03-02 DIAGNOSIS — N800 Endometriosis of uterus: Secondary | ICD-10-CM | POA: Insufficient documentation

## 2016-03-02 DIAGNOSIS — Z8049 Family history of malignant neoplasm of other genital organs: Secondary | ICD-10-CM | POA: Insufficient documentation

## 2016-03-02 DIAGNOSIS — Z9889 Other specified postprocedural states: Secondary | ICD-10-CM | POA: Diagnosis not present

## 2016-03-02 DIAGNOSIS — N72 Inflammatory disease of cervix uteri: Secondary | ICD-10-CM | POA: Diagnosis not present

## 2016-03-02 HISTORY — PX: LAPAROSCOPIC HYSTERECTOMY: SHX1926

## 2016-03-02 HISTORY — PX: CYSTOSCOPY: SHX5120

## 2016-03-02 HISTORY — PX: EXCISION OF SKIN TAG: SHX6270

## 2016-03-02 HISTORY — PX: BILATERAL SALPINGECTOMY: SHX5743

## 2016-03-02 LAB — POCT PREGNANCY, URINE: Preg Test, Ur: NEGATIVE

## 2016-03-02 LAB — POCT I-STAT 4, (NA,K, GLUC, HGB,HCT)
Glucose, Bld: 103 mg/dL — ABNORMAL HIGH (ref 65–99)
HCT: 39 % (ref 36.0–46.0)
Hemoglobin: 13.3 g/dL (ref 12.0–15.0)
POTASSIUM: 3.3 mmol/L — AB (ref 3.5–5.1)
SODIUM: 138 mmol/L (ref 135–145)

## 2016-03-02 SURGERY — HYSTERECTOMY, TOTAL, LAPAROSCOPIC
Anesthesia: General

## 2016-03-02 MED ORDER — CEFAZOLIN SODIUM-DEXTROSE 2-4 GM/100ML-% IV SOLN
INTRAVENOUS | Status: AC
  Filled 2016-03-02: qty 100

## 2016-03-02 MED ORDER — BUPIVACAINE HCL (PF) 0.5 % IJ SOLN
INTRAMUSCULAR | Status: AC
  Filled 2016-03-02: qty 30

## 2016-03-02 MED ORDER — BUPIVACAINE HCL (PF) 0.5 % IJ SOLN
INTRAMUSCULAR | Status: DC | PRN
  Administered 2016-03-02: 8 mL

## 2016-03-02 MED ORDER — CEFAZOLIN SODIUM-DEXTROSE 2-4 GM/100ML-% IV SOLN: 2.0000 g | Freq: Once | INTRAVENOUS | Status: DC

## 2016-03-02 MED ORDER — HYDROMORPHONE HCL 1 MG/ML IJ SOLN
INTRAMUSCULAR | Status: AC
  Filled 2016-03-02: qty 1

## 2016-03-02 MED ORDER — OXYCODONE HCL 5 MG PO TABS
5.0000 mg | ORAL_TABLET | ORAL | Status: DC | PRN
  Administered 2016-03-02 – 2016-03-03 (×6): 10 mg via ORAL
  Filled 2016-03-02 (×6): qty 2

## 2016-03-02 MED ORDER — BACITRACIN ZINC 500 UNIT/GM EX OINT
TOPICAL_OINTMENT | CUTANEOUS | Status: DC | PRN
  Administered 2016-03-02: 1 via TOPICAL

## 2016-03-02 MED ORDER — FAMOTIDINE 20 MG PO TABS
ORAL_TABLET | ORAL | Status: AC
  Administered 2016-03-02: 20 mg via ORAL
  Filled 2016-03-02: qty 1

## 2016-03-02 MED ORDER — FENTANYL CITRATE (PF) 100 MCG/2ML IJ SOLN
INTRAMUSCULAR | Status: DC | PRN
  Administered 2016-03-02 (×4): 50 ug via INTRAVENOUS

## 2016-03-02 MED ORDER — LIDOCAINE HCL (CARDIAC) 20 MG/ML IV SOLN
INTRAVENOUS | Status: DC | PRN
  Administered 2016-03-02: 80 mg via INTRAVENOUS

## 2016-03-02 MED ORDER — DIPHENHYDRAMINE HCL 50 MG/ML IJ SOLN: 12.5000 mg | Freq: Four times a day (QID) | INTRAMUSCULAR | Status: DC | PRN

## 2016-03-02 MED ORDER — LACTATED RINGERS IV SOLN: INTRAVENOUS | Status: DC

## 2016-03-02 MED ORDER — LACTATED RINGERS IV SOLN
INTRAVENOUS | Status: DC
  Administered 2016-03-02: 09:00:00 via INTRAVENOUS

## 2016-03-02 MED ORDER — ACETAMINOPHEN 10 MG/ML IV SOLN
INTRAVENOUS | Status: DC | PRN
  Administered 2016-03-02: 1000 mg via INTRAVENOUS

## 2016-03-02 MED ORDER — SERTRALINE HCL 50 MG PO TABS
50.0000 mg | ORAL_TABLET | Freq: Every day | ORAL | Status: DC
  Administered 2016-03-02: 50 mg via ORAL
  Filled 2016-03-02: qty 1

## 2016-03-02 MED ORDER — PHENAZOPYRIDINE HCL 200 MG PO TABS
200.0000 mg | ORAL_TABLET | Freq: Once | ORAL | Status: AC
  Administered 2016-03-02: 200 mg via ORAL
  Filled 2016-03-02: qty 1

## 2016-03-02 MED ORDER — PROPOFOL 10 MG/ML IV BOLUS
INTRAVENOUS | Status: DC | PRN
  Administered 2016-03-02: 200 mg via INTRAVENOUS

## 2016-03-02 MED ORDER — ACETAMINOPHEN 325 MG PO TABS
650.0000 mg | ORAL_TABLET | Freq: Four times a day (QID) | ORAL | Status: DC | PRN
  Administered 2016-03-03 (×2): 650 mg via ORAL
  Filled 2016-03-02 (×2): qty 2

## 2016-03-02 MED ORDER — DEXAMETHASONE SODIUM PHOSPHATE 10 MG/ML IJ SOLN
INTRAMUSCULAR | Status: DC | PRN
  Administered 2016-03-02: 10 mg via INTRAVENOUS

## 2016-03-02 MED ORDER — BUPIVACAINE HCL (PF) 0.5 % IJ SOLN
INTRAMUSCULAR | Status: DC | PRN
  Administered 2016-03-02: 1 mL

## 2016-03-02 MED ORDER — ROCURONIUM BROMIDE 100 MG/10ML IV SOLN
INTRAVENOUS | Status: DC | PRN
  Administered 2016-03-02: 5 mg via INTRAVENOUS
  Administered 2016-03-02 (×2): 20 mg via INTRAVENOUS
  Administered 2016-03-02: 25 mg via INTRAVENOUS

## 2016-03-02 MED ORDER — ONDANSETRON HCL 4 MG/2ML IJ SOLN: 4.0000 mg | Freq: Four times a day (QID) | INTRAMUSCULAR | Status: DC | PRN

## 2016-03-02 MED ORDER — FENTANYL CITRATE (PF) 100 MCG/2ML IJ SOLN
25.0000 ug | INTRAMUSCULAR | Status: DC | PRN
  Administered 2016-03-02 (×3): 50 ug via INTRAVENOUS

## 2016-03-02 MED ORDER — BACITRACIN ZINC 500 UNIT/GM EX OINT
TOPICAL_OINTMENT | CUTANEOUS | Status: AC
  Filled 2016-03-02: qty 28.35

## 2016-03-02 MED ORDER — HYDROCHLOROTHIAZIDE 25 MG PO TABS
25.0000 mg | ORAL_TABLET | Freq: Every day | ORAL | Status: DC
  Administered 2016-03-03: 25 mg via ORAL
  Filled 2016-03-02: qty 1

## 2016-03-02 MED ORDER — KETOROLAC TROMETHAMINE 15 MG/ML IJ SOLN
15.0000 mg | Freq: Four times a day (QID) | INTRAMUSCULAR | Status: AC
  Administered 2016-03-02: 15 mg via INTRAVENOUS
  Filled 2016-03-02: qty 1

## 2016-03-02 MED ORDER — FENTANYL CITRATE (PF) 100 MCG/2ML IJ SOLN
INTRAMUSCULAR | Status: AC
  Filled 2016-03-02: qty 2

## 2016-03-02 MED ORDER — ACETAMINOPHEN 650 MG RE SUPP
650.0000 mg | Freq: Four times a day (QID) | RECTAL | Status: DC | PRN
  Filled 2016-03-02: qty 1

## 2016-03-02 MED ORDER — DIPHENHYDRAMINE HCL 12.5 MG/5ML PO ELIX
12.5000 mg | ORAL_SOLUTION | Freq: Four times a day (QID) | ORAL | Status: DC | PRN
  Filled 2016-03-02: qty 5

## 2016-03-02 MED ORDER — HYDROMORPHONE HCL 1 MG/ML IJ SOLN
INTRAMUSCULAR | Status: DC | PRN
  Administered 2016-03-02: 1 mg via INTRAVENOUS

## 2016-03-02 MED ORDER — MIDAZOLAM HCL 2 MG/2ML IJ SOLN
INTRAMUSCULAR | Status: DC | PRN
  Administered 2016-03-02: 2 mg via INTRAVENOUS

## 2016-03-02 MED ORDER — CEFAZOLIN SODIUM-DEXTROSE 2-3 GM-% IV SOLR
INTRAVENOUS | Status: DC | PRN
Start: 2016-03-02 — End: 2016-03-02
  Administered 2016-03-02: 2 g via INTRAVENOUS

## 2016-03-02 MED ORDER — ONDANSETRON HCL 4 MG/2ML IJ SOLN: 4.0000 mg | Freq: Once | INTRAMUSCULAR | Status: DC | PRN

## 2016-03-02 MED ORDER — SIMETHICONE 80 MG PO CHEW: 40.0000 mg | CHEWABLE_TABLET | Freq: Four times a day (QID) | ORAL | Status: DC | PRN

## 2016-03-02 MED ORDER — FENTANYL CITRATE (PF) 100 MCG/2ML IJ SOLN
50.0000 ug | Freq: Once | INTRAMUSCULAR | Status: AC
  Administered 2016-03-02: 50 ug via INTRAVENOUS

## 2016-03-02 MED ORDER — SUCCINYLCHOLINE CHLORIDE 20 MG/ML IJ SOLN
INTRAMUSCULAR | Status: DC | PRN
  Administered 2016-03-02: 140 mg via INTRAVENOUS

## 2016-03-02 MED ORDER — ACETAMINOPHEN 10 MG/ML IV SOLN
INTRAVENOUS | Status: AC
  Filled 2016-03-02: qty 100

## 2016-03-02 MED ORDER — ONDANSETRON HCL 4 MG/2ML IJ SOLN
INTRAMUSCULAR | Status: DC | PRN
  Administered 2016-03-02: 4 mg via INTRAVENOUS

## 2016-03-02 MED ORDER — DOCUSATE SODIUM 100 MG PO CAPS
100.0000 mg | ORAL_CAPSULE | Freq: Two times a day (BID) | ORAL | Status: DC
  Administered 2016-03-02 – 2016-03-03 (×2): 100 mg via ORAL
  Filled 2016-03-02 (×2): qty 1

## 2016-03-02 MED ORDER — KETOROLAC TROMETHAMINE 15 MG/ML IJ SOLN
15.0000 mg | Freq: Four times a day (QID) | INTRAMUSCULAR | Status: DC | PRN
  Administered 2016-03-02 – 2016-03-03 (×2): 15 mg via INTRAVENOUS
  Filled 2016-03-02 (×2): qty 1

## 2016-03-02 MED ORDER — FAMOTIDINE 20 MG PO TABS
20.0000 mg | ORAL_TABLET | Freq: Once | ORAL | Status: AC
  Administered 2016-03-02: 20 mg via ORAL

## 2016-03-02 MED ORDER — SUGAMMADEX SODIUM 200 MG/2ML IV SOLN
INTRAVENOUS | Status: DC | PRN
  Administered 2016-03-02: 300 mg via INTRAVENOUS

## 2016-03-02 MED ORDER — ONDANSETRON 4 MG PO TBDP: 4.0000 mg | ORAL_TABLET | Freq: Four times a day (QID) | ORAL | Status: DC | PRN

## 2016-03-02 MED ORDER — HYDROMORPHONE HCL 1 MG/ML IJ SOLN
0.5000 mg | INTRAMUSCULAR | Status: AC | PRN
  Administered 2016-03-02 (×4): 0.5 mg via INTRAVENOUS

## 2016-03-02 SURGICAL SUPPLY — 54 items
BAG URO DRAIN 2000ML W/SPOUT (MISCELLANEOUS) ×5 IMPLANT
BLADE SURG SZ11 CARB STEEL (BLADE) ×5 IMPLANT
CANISTER SUCT 1200ML W/VALVE (MISCELLANEOUS) ×5 IMPLANT
CATH FOL 2WAY LX 16X5 (CATHETERS) ×5 IMPLANT
CATH FOL LEG HOLDER (MISCELLANEOUS) ×5 IMPLANT
CATH FOLEY 2WAY  5CC 16FR (CATHETERS) ×2
CATH URTH 16FR FL 2W BLN LF (CATHETERS) ×3 IMPLANT
CHLORAPREP W/TINT 26ML (MISCELLANEOUS) ×10 IMPLANT
CLOSURE WOUND 1/4X4 (GAUZE/BANDAGES/DRESSINGS) ×1
DEFOGGER SCOPE WARMER CLEARIFY (MISCELLANEOUS) ×5 IMPLANT
DEVICE SUTURE ENDOST 10MM (ENDOMECHANICALS) ×5 IMPLANT
DRAPE STERI POUCH LG 24X46 STR (DRAPES) ×15 IMPLANT
DRSG TEGADERM 2-3/8X2-3/4 SM (GAUZE/BANDAGES/DRESSINGS) ×15 IMPLANT
GAUZE SPONGE NON-WVN 2X2 STRL (MISCELLANEOUS) ×6 IMPLANT
GLOVE BIO SURGEON STRL SZ 6 (GLOVE) ×20 IMPLANT
GLOVE INDICATOR 6.5 STRL GRN (GLOVE) ×30 IMPLANT
GOWN STRL REUS W/ TWL LRG LVL3 (GOWN DISPOSABLE) ×6 IMPLANT
GOWN STRL REUS W/ TWL XL LVL3 (GOWN DISPOSABLE) ×3 IMPLANT
GOWN STRL REUS W/TWL LRG LVL3 (GOWN DISPOSABLE) ×4
GOWN STRL REUS W/TWL MED LVL3 (GOWN DISPOSABLE) ×5 IMPLANT
GOWN STRL REUS W/TWL XL LVL3 (GOWN DISPOSABLE) ×2
GRASPER SUT TROCAR 14GX15 (MISCELLANEOUS) ×5 IMPLANT
IRRIGATION STRYKERFLOW (MISCELLANEOUS) ×3 IMPLANT
IRRIGATOR STRYKERFLOW (MISCELLANEOUS) ×5
IV LACTATED RINGERS 1000ML (IV SOLUTION) ×5 IMPLANT
KIT PINK PAD W/HEAD ARE REST (MISCELLANEOUS) ×5
KIT PINK PAD W/HEAD ARM REST (MISCELLANEOUS) ×3 IMPLANT
KIT RM TURNOVER CYSTO AR (KITS) ×5 IMPLANT
LABEL OR SOLS (LABEL) IMPLANT
LIGASURE BLUNT 5MM 37CM (INSTRUMENTS) ×5 IMPLANT
MANIPULATOR VCARE LG CRV RETR (MISCELLANEOUS) IMPLANT
MANIPULATOR VCARE SML CRV RETR (MISCELLANEOUS) IMPLANT
MANIPULATOR VCARE STD CRV RETR (MISCELLANEOUS) ×5 IMPLANT
NEEDLE VERESS 14GA 120MM (NEEDLE) ×5 IMPLANT
NS IRRIG 500ML POUR BTL (IV SOLUTION) ×5 IMPLANT
OCCLUDER COLPOPNEUMO (BALLOONS) ×5 IMPLANT
PACK GYN LAPAROSCOPIC (MISCELLANEOUS) ×5 IMPLANT
PAD OB MATERNITY 4.3X12.25 (PERSONAL CARE ITEMS) ×5 IMPLANT
PAD PREP 24X41 OB/GYN DISP (PERSONAL CARE ITEMS) ×5 IMPLANT
SCISSORS METZENBAUM CVD 33 (INSTRUMENTS) ×5 IMPLANT
SET CYSTO W/LG BORE CLAMP LF (SET/KITS/TRAYS/PACK) IMPLANT
SLEEVE ENDOPATH XCEL 5M (ENDOMECHANICALS) ×5 IMPLANT
SPONGE VERSALON 2X2 STRL (MISCELLANEOUS) ×4
STRIP CLOSURE SKIN 1/4X4 (GAUZE/BANDAGES/DRESSINGS) ×4 IMPLANT
SUT ENDO VLOC 180-0-8IN (SUTURE) ×5 IMPLANT
SUT VIC AB 0 UR5 27 (SUTURE) ×10 IMPLANT
SUT VIC AB 2-0 UR6 27 (SUTURE) ×5 IMPLANT
SWABSTK COMLB BENZOIN TINCTURE (MISCELLANEOUS) ×5 IMPLANT
SYR 50ML LL SCALE MARK (SYRINGE) ×5 IMPLANT
SYRINGE 10CC LL (SYRINGE) ×5 IMPLANT
TROCAR 5M 150ML BLDLS (TROCAR) ×5 IMPLANT
TROCAR ENDO BLADELESS 11MM (ENDOMECHANICALS) ×5 IMPLANT
TROCAR XCEL NON-BLD 5MMX100MML (ENDOMECHANICALS) ×5 IMPLANT
TUBING INSUFFLATOR HEATED (MISCELLANEOUS) ×5 IMPLANT

## 2016-03-02 NOTE — Transfer of Care (Signed)
2Immediate Anesthesia Transfer of Care Note  Patient: Amber Rodgers  Procedure(s) Performed: Procedure(s): HYSTERECTOMY TOTAL LAPAROSCOPIC (N/A) BILATERAL SALPINGECTOMY (Bilateral) CYSTOSCOPY (N/A) EXCISION OF SKIN TAG  Patient Location: PACU  Anesthesia Type:General  Level of Consciousness: awake  Airway & Oxygen Therapy: Patient Spontanous Breathing and Patient connected to face mask oxygen  Post-op Assessment: Report given to RN and Post -op Vital signs reviewed and stable  Post vital signs: Reviewed and stable  Last Vitals:  Filed Vitals:   03/02/16 0758  BP: 150/90  Pulse: 95  Temp: 37.2 C  Resp: 16    Last Pain: There were no vitals filed for this visit.       Complications: No apparent anesthesia complications

## 2016-03-02 NOTE — Anesthesia Preprocedure Evaluation (Signed)
Anesthesia Evaluation  Patient identified by MRN, date of birth, ID band Patient awake    Reviewed: Allergy & Precautions, H&P , NPO status , Patient's Chart, lab work & pertinent test results, reviewed documented beta blocker date and time   History of Anesthesia Complications (+) PONV and history of anesthetic complications  Airway Mallampati: II  TM Distance: >3 FB Neck ROM: full    Dental no notable dental hx. (+) Teeth Intact   Pulmonary neg pulmonary ROS, former smoker,    Pulmonary exam normal breath sounds clear to auscultation       Cardiovascular Exercise Tolerance: Good hypertension, (-) angina(-) CAD, (-) Past MI, (-) Cardiac Stents and (-) CABG Normal cardiovascular exam(-) dysrhythmias (-) Valvular Problems/Murmurs Rhythm:regular Rate:Normal     Neuro/Psych negative neurological ROS  negative psych ROS   GI/Hepatic Neg liver ROS, GERD  Controlled,  Endo/Other  neg diabetesMorbid obesity  Renal/GU negative Renal ROS  negative genitourinary   Musculoskeletal   Abdominal   Peds  Hematology negative hematology ROS (+)   Anesthesia Other Findings Past Medical History:   Hypertension                                                 Anxiety                                                      Complication of anesthesia                                     Comment:Nausea/ Vomiting   Reproductive/Obstetrics negative OB ROS                             Anesthesia Physical Anesthesia Plan  ASA: III  Anesthesia Plan: General   Post-op Pain Management:    Induction:   Airway Management Planned:   Additional Equipment:   Intra-op Plan:   Post-operative Plan:   Informed Consent: I have reviewed the patients History and Physical, chart, labs and discussed the procedure including the risks, benefits and alternatives for the proposed anesthesia with the patient or authorized  representative who has indicated his/her understanding and acceptance.   Dental Advisory Given  Plan Discussed with: Anesthesiologist, CRNA and Surgeon  Anesthesia Plan Comments:         Anesthesia Quick Evaluation

## 2016-03-02 NOTE — Op Note (Signed)
Amber Rodgers PROCEDURE DATE: 03/02/2016  PREOPERATIVE DIAGNOSIS: Adenocarcinoma-in-situ of cervix, 3cm skin nevus on right buttock POSTOPERATIVE DIAGNOSIS: The same PROCEDURE: Total laparoscopic hysterectomy, bilateral salpingectomy, cystoscopy, right buttock skin nevus excision SURGEON:  Dr. Joylene Igo ASSISTANT: Dr. Benjaman Kindler Anesthesiologist: No responsible provider has been recorded for the case. Anesthesiologist: Martha Clan, MD CRNA: Demetrius Charity, CRNA; Johnna Acosta, CRNA  INDICATIONS: 40 y.o. G1P1  here for definitive surgical management secondary to the indications listed under preoperative diagnoses; please see preoperative note for further details.  Risks of surgery were discussed with the patient including but not limited to: bleeding which may require transfusion or reoperation; infection which may require antibiotics; injury to bowel, bladder, ureters or other surrounding organs; need for additional procedures; thromboembolic phenomenon, incisional problems and other postoperative/anesthesia complications. Written informed consent was obtained.    FINDINGS:  Normal tubes and ovaries, 10 wk sized uterus  ANESTHESIA:    General INTRAVENOUS FLUIDS:1600 ml ESTIMATED BLOOD LOSS:200 ml URINE OUTPUT: 400 ml   SPECIMENS: Uterus, cervix, bilateral fallopian tubes COMPLICATIONS: None immediate  PROCEDURE IN DETAIL:  The patient received prophylactic ancef 2g intravenous antibiotics, pyridium 200mg  PO, and had sequential compression devices applied to her lower extremities while in the preoperative area.  She was then taken to the operating room where general anesthesia was administered and was found to be adequate.  She was placed in the dorsal lithotomy position, and was prepped and draped in a sterile manner.  A formal time out was performed with all team members present and in agreement.  A V-care uterine manipulator was placed at this time.  A Foley catheter was  inserted into her bladder and attached to constant drainage. Attention was turned to the abdomen where 2cc 0.5% sensocaine was injected at the incision site. A 28mm incision was made in the inferior base of the umbilical plate with an [id number] blade. A bariatric 78mm trochar was inserted under direct visualization into the abdomen. Opening pressure <5.  The abdomen was then insufflated with carbon dioxide gas and adequate pneumoperitoneum was obtained.  A survey of the patient's pelvis and abdomen revealed the findings above.  Bilateral lower quadrant ports (5 mm on the right and 10 mm on the left) were then placed under direct visualization after 1cc sensocaine injection.   The bilateral round and broad ligaments were then clamped and transected with the Ligasure device.  The uterine artery was then skeletonized and a bladder flap was created.  The ureters were noted to be safely away from the area of dissection.  The bladder was dissected off the lower uterine segment.  The left uterine arteries were ligated using the LIgasure but not cut. Attention was then turned to the right uterine vessels which were clamped, ligated and cut. A releasing cut was also done. Attention then turned back to the left uterine vessels, which were ligated and now cut. A releasing cut was also done.    Attention was then turned to the cervicovaginal junction, and the monopolar scissors were used on both cut and coag to transect the cervix from the surrounding vagina using the ring of the V-care as a guide. This was done circumferentially allowing total hysterectomy.    The bilateral fallopian tubes were dissected off of the ovary bilaterally and removed through the port.  The uterus was removed through the vagina. The vaginal cuff was closed using V-Lock on the Endostitch. Care was taken to incorporate the uterosacrals. Hemostasis was noted.    Cystoscopy  showed bilateral ureteral jets.  No stitches were visualized in the bladder  during cystoscopy.   The abdomen was then re-insufflated. The 38mm left lower quadrant port fascia was closed using the cone and two 0-vicryl stitches. Fascia was noted to be reapproximated. Excellent hemostasis was again noted at the cuff and along bilateral salpingectomy sites.   All trocars were removed after the abdomen was desufflated and the patient was given three deep breaths.  All skin incisions were closed with 4-0 Vicryl subcuticular stitches. Benzoin and steristrips were applied to the 2 lower quadrant ports with tegrederm covering all three sites.   1cc of 0.5% sensocaine was injected to the base of the pedunculated skin nevus. Using an 11# blade, the base of the nevus was excised in an elliptical manner. The base was cauterized with the bovie. The skin was reapproximated with 3 mattress sutures using 4-0 prolene. The skin edges were not on significant tension. Bacitracin and a tegrederm dressing were applied to the site. The sutures will stay in place for 14 days and will be removed at her 2 week post-operative appointment.  The patient tolerated the procedures well.  All instruments, needles, and sponge counts were correct x 2. The patient was taken to the recovery room awake, extubated and in stable condition.   [name], MD

## 2016-03-02 NOTE — Interval H&P Note (Signed)
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 After consideration of risks, benefits and other options for treatment, the patient has consented to  Procedure(s): HYSTERECTOMY TOTAL LAPAROSCOPIC (N/A) BILATERAL SALPINGECTOMY (Bilateral) CYSTOSCOPY (N/A) as a surgical intervention .  The patient's history has been reviewed, patient examined, no change in status, stable for surgery.  I have reviewed the patient's chart and labs.  Questions were answered to the patient's satisfaction.  Cleared by PCP for abnormal EKG on preop testing. Potassium still low at 3.3 after PO potassium supplementation. Will order IV potassium for intraop.    [place]

## 2016-03-02 NOTE — Anesthesia Procedure Notes (Signed)
Procedure Name: Intubation Date/Time: 03/02/2016 9:44 AM Performed by: Johnna Acosta Pre-anesthesia Checklist: Patient identified, Emergency Drugs available, Suction available, Patient being monitored and Timeout performed Patient Re-evaluated:Patient Re-evaluated prior to inductionOxygen Delivery Method: Circle system utilized Preoxygenation: Pre-oxygenation with 100% oxygen Intubation Type: IV induction Ventilation: Mask ventilation without difficulty and Oral airway inserted - appropriate to patient size Laryngoscope Size: Sabra Heck and 2 Grade View: Grade I Tube type: Oral Tube size: 7.5 mm Number of attempts: 1 Airway Equipment and Method: Patient positioned with wedge pillow and Stylet Placement Confirmation: ETT inserted through vocal cords under direct vision,  positive ETCO2 and breath sounds checked- equal and bilateral Secured at: 22 cm Tube secured with: Tape Dental Injury: Teeth and Oropharynx as per pre-operative assessment

## 2016-03-03 DIAGNOSIS — D069 Carcinoma in situ of cervix, unspecified: Secondary | ICD-10-CM | POA: Diagnosis not present

## 2016-03-03 LAB — CBC
HEMATOCRIT: 36.2 % (ref 35.0–47.0)
HEMOGLOBIN: 12.1 g/dL (ref 12.0–16.0)
MCH: 27.9 pg (ref 26.0–34.0)
MCHC: 33.4 g/dL (ref 32.0–36.0)
MCV: 83.5 fL (ref 80.0–100.0)
PLATELETS: 307 10*3/uL (ref 150–440)
RBC: 4.33 MIL/uL (ref 3.80–5.20)
RDW: 13.2 % (ref 11.5–14.5)
WBC: 16.5 10*3/uL — ABNORMAL HIGH (ref 3.6–11.0)

## 2016-03-03 LAB — BASIC METABOLIC PANEL
ANION GAP: 7 (ref 5–15)
BUN: 11 mg/dL (ref 6–20)
CO2: 29 mmol/L (ref 22–32)
Calcium: 8.4 mg/dL — ABNORMAL LOW (ref 8.9–10.3)
Chloride: 101 mmol/L (ref 101–111)
Creatinine, Ser: 0.68 mg/dL (ref 0.44–1.00)
GFR calc Af Amer: >60 mL/min (ref >60)
GLUCOSE: 127 mg/dL — AB (ref 65–99)
POTASSIUM: 3.4 mmol/L — AB (ref 3.5–5.1)
Sodium: 137 mmol/L (ref 135–145)

## 2016-03-03 MED ORDER — DOCUSATE SODIUM 100 MG PO CAPS: 100.0000 mg | ORAL_CAPSULE | Freq: Every day | ORAL | Status: AC

## 2016-03-03 MED ORDER — OXYCODONE HCL 5 MG PO TABS: 5.0000 mg | ORAL_TABLET | ORAL | Status: AC | PRN

## 2016-03-03 NOTE — Progress Notes (Signed)
Patient understands all discharge instructions and the need to make follow up appointments. Patient discharge via wheelchair with auxillary. 

## 2016-03-03 NOTE — Discharge Summary (Signed)
Physician Discharge Summary  Patient ID: Amber Rodgers MRN: [id number] DOB/AGE: [birth year] 40 y.o.  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 Uncomplicated procedure. Patient met all postoperative milestones. Discharged home on POD#1.   Discharge Exam: Blood pressure 110/64, pulse 67, temperature 97.4 F (36.3 C), temperature source Oral, resp. rate 18, height '5\' 7"'  (1.702 m), weight 147.419 kg (325 lb), last menstrual period 02/15/2016, SpO2 96 %. General appearance: alert, cooperative and no distress Head: Normocephalic, without obvious abnormality, atraumatic GI: soft, non-tender; bowel sounds normal; no masses,  no organomegaly Pelvic: min bleedinh Extremities: extremities normal, atraumatic, no cyanosis or edema Skin: Skin color, texture, turgor normal. No rashes or lesions - incisions without evidence of infection, healing well. 3 abdominal incisions (1 umbilical, uncovered; RLQ 36m incision, covered with steristrip, 11mLLQ incision, covered with steristrip, some skin puckering; 3cm right buttock incision, 3 mattress sutures of prolene, uncovered)  Disposition: 01-Home or Self Care     Medication List    STOP taking these medications        ferrous sulfate 325 (65 FE) MG tablet      TAKE these medications        docusate sodium 100 MG capsule  Commonly known as:  COLACE  Take 1 capsule (100 mg total) by mouth daily.     hydrochlorothiazide 25 MG tablet  Commonly known as:  HYDRODIURIL  Take 25 mg by mouth daily.     LORazepam 0.5 MG tablet  Commonly known as:  ATIVAN  Take 0.5 mg by mouth every 8 (eight) hours as needed.     MELATONIN PO  Take 1 tablet by mouth as needed.     oxyCODONE 5 MG immediate release tablet  Commonly known as:   Oxy IR/ROXICODONE  Take 1 tablet (5 mg total) by mouth every 4 (four) hours as needed for moderate pain.     sertraline 25 MG tablet  Commonly known as:  ZOLOFT  Take 50 mg by mouth every evening.     VITAMIN B12 PO  Take 1 tablet by mouth daily.     VITAMIN D PO  Take 1 tablet by mouth every morning.           Follow-up Information    Follow up with JoLorette AngMD In 2 weeks.   Specialty:  Obstetrics and Gynecology   Contact information:   [address] [phone]     Signed: JoLorette Ang/11/2015, 12:50 PM

## 2016-03-03 NOTE — Discharge Instructions (Signed)
Total Laparoscopic Hysterectomy, Care After Refer to this sheet in the next few weeks. These instructions provide you with information on caring for yourself after your procedure. Your health care provider may also give you more specific instructions. Your treatment has been planned according to current medical practices, but problems sometimes occur. Call your health care provider if you have any problems or questions after your procedure. WHAT TO EXPECT AFTER THE PROCEDURE  Pain and bruising at the incision sites. You will be given pain medicine to control it.  Menopausal symptoms such as hot flashes, night sweats, and insomnia if your ovaries were removed.  Sore throat from the breathing tube that was inserted during surgery. HOME CARE INSTRUCTIONS  Only take over-the-counter or prescription medicines for pain, discomfort, or fever as directed by your health care provider.   Do not take aspirin. It can cause bleeding.   Do not drive when taking pain medicine.   Follow your health care provider's advice regarding diet, exercise, lifting, driving, and general activities.   Resume your usual diet as directed and allowed.   Get plenty of rest and sleep.   Do not douche, use tampons, or have sexual intercourse for at least 6 weeks, or until your health care provider gives you permission.   Change your bandages (dressings) as directed by your health care provider. REMOVE STERISTRIPS in 7 days  USE BACITRACIN OR NEOSPORIN AS NEEDED TO INCISION ON RIGHT BUTTOCK  Monitor your temperature and notify your health care provider of a fever.   Take showers instead of baths for 6 weeks.   Do not drink alcohol until your health care provider gives you permission.   If you develop constipation, you may take a mild laxative with your health care provider's permission. Bran foods may help with constipation problems. Drinking enough fluids to keep your urine clear or pale yellow may help  as well.   Try to have someone home with you for 1-2 weeks to help around the house.   Keep all of your follow-up appointments as directed by your health care provider.  SEEK MEDICAL CARE IF:  You have swelling, redness, or increasing pain around your incision sites.   You have pus coming from your incision.   You notice a bad smell coming from your incision.   Your incision breaks open.   You feel dizzy or lightheaded.   You have pain or bleeding when you urinate.   You have persistent diarrhea.   You have persistent nausea and vomiting.   You have abnormal vaginal discharge.   You have a rash.   You have any type of abnormal reaction or develop an allergy to your medicine.   You have poor pain control with your prescribed medicine.  SEEK IMMEDIATE MEDICAL CARE IF:  You have chest pain or shortness of breath.  You have severe abdominal pain that is not relieved with pain medicine.  You have pain or swelling in your legs. MAKE SURE YOU:  Understand these instructions.  Will watch your condition.  Will get help right away if you are not doing well or get worse.   This information is not intended to replace advice given to you by your health care provider. Make sure you discuss any questions you have with your health care provider.   Document Released: 07/08/2013 Document Revised: 09/22/2013 Document Reviewed: 07/08/2013 Elsevier Interactive Patient Education [employer].

## 2016-03-05 ENCOUNTER — Encounter: Payer: Self-pay | Admitting: Obstetrics and Gynecology

## 2016-03-05 LAB — SURGICAL PATHOLOGY

## 2016-03-05 NOTE — Anesthesia Postprocedure Evaluation (Signed)
Anesthesia Post Note  Patient: Amber Rodgers  Procedure(s) Performed: Procedure(s) (LRB): HYSTERECTOMY TOTAL LAPAROSCOPIC (N/A) BILATERAL SALPINGECTOMY (Bilateral) CYSTOSCOPY (N/A) EXCISION OF SKIN TAG  Patient location during evaluation: PACU Anesthesia Type: General Level of consciousness: awake and alert Pain management: pain level controlled Vital Signs Assessment: post-procedure vital signs reviewed and stable Respiratory status: spontaneous breathing, nonlabored ventilation, respiratory function stable and patient connected to nasal cannula oxygen Cardiovascular status: blood pressure returned to baseline and stable Postop Assessment: no signs of nausea or vomiting Anesthetic complications: no    Last Vitals:  Filed Vitals:   03/03/16 0738 03/03/16 1248  BP: 127/77 110/64  Pulse: 95 67  Temp: 36.8 C 36.3 C  Resp: 18 18    Last Pain:  Filed Vitals:   03/03/16 1301  PainSc: 4                  Martha Clan

## 2016-04-04 ENCOUNTER — Other Ambulatory Visit: Payer: Self-pay | Admitting: Family Medicine

## 2016-04-04 DIAGNOSIS — Z1231 Encounter for screening mammogram for malignant neoplasm of breast: Secondary | ICD-10-CM

## 2016-04-16 ENCOUNTER — Ambulatory Visit
Admission: RE | Admit: 2016-04-16 | Discharge: 2016-04-16 | Disposition: A | Discharge status: Home / Self Care | Payer: BC Managed Care – PPO | Source: Ambulatory Visit | Attending: Family Medicine | Admitting: Family Medicine

## 2016-04-16 DIAGNOSIS — R928 Other abnormal and inconclusive findings on diagnostic imaging of breast: Secondary | ICD-10-CM | POA: Diagnosis not present

## 2016-04-16 DIAGNOSIS — Z1231 Encounter for screening mammogram for malignant neoplasm of breast: Secondary | ICD-10-CM | POA: Insufficient documentation

## 2016-04-16 HISTORY — DX: Malignant (primary) neoplasm, unspecified: C80.1

## 2016-04-24 ENCOUNTER — Other Ambulatory Visit: Payer: Self-pay | Admitting: Family Medicine

## 2016-04-24 DIAGNOSIS — N6320 Unspecified lump in the left breast, unspecified quadrant: Secondary | ICD-10-CM

## 2016-04-30 ENCOUNTER — Ambulatory Visit
Admission: RE | Admit: 2016-04-30 | Discharge: 2016-04-30 | Disposition: A | Discharge status: Home / Self Care | Payer: BC Managed Care – PPO | Source: Ambulatory Visit | Attending: Family Medicine | Admitting: Family Medicine

## 2016-04-30 DIAGNOSIS — N63 Unspecified lump in breast: Secondary | ICD-10-CM | POA: Insufficient documentation

## 2016-04-30 DIAGNOSIS — N6320 Unspecified lump in the left breast, unspecified quadrant: Secondary | ICD-10-CM

## 2017-03-28 ENCOUNTER — Other Ambulatory Visit: Payer: Self-pay | Admitting: Family Medicine

## 2017-03-28 DIAGNOSIS — Z1231 Encounter for screening mammogram for malignant neoplasm of breast: Secondary | ICD-10-CM

## 2017-04-23 ENCOUNTER — Inpatient Hospital Stay: Admission: RE | Admit: 2017-04-23 | Payer: BC Managed Care – PPO | Source: Ambulatory Visit

## 2017-06-04 ENCOUNTER — Ambulatory Visit
Admission: RE | Admit: 2017-06-04 | Discharge: 2017-06-04 | Disposition: A | Discharge status: Home / Self Care | Payer: BC Managed Care – PPO | Source: Ambulatory Visit | Attending: Family Medicine | Admitting: Family Medicine

## 2017-06-04 DIAGNOSIS — Z1231 Encounter for screening mammogram for malignant neoplasm of breast: Secondary | ICD-10-CM | POA: Insufficient documentation

## 2017-09-01 IMAGING — MG MM DIGITAL DIAGNOSTIC UNILAT*L* W/ TOMO W/ CAD
6 series · 6 of 14 positions shown · non-contrast
Comparison: Baseline screening mammogram dated 04/16/2016.

CLINICAL DATA: Patient was called back from screening mammogram for
a possible left breast mass.

EXAM:
2D DIGITAL DIAGNOSTIC UNILATERAL LEFT MAMMOGRAM WITH CAD AND ADJUNCT
TOMO

[L MLO]
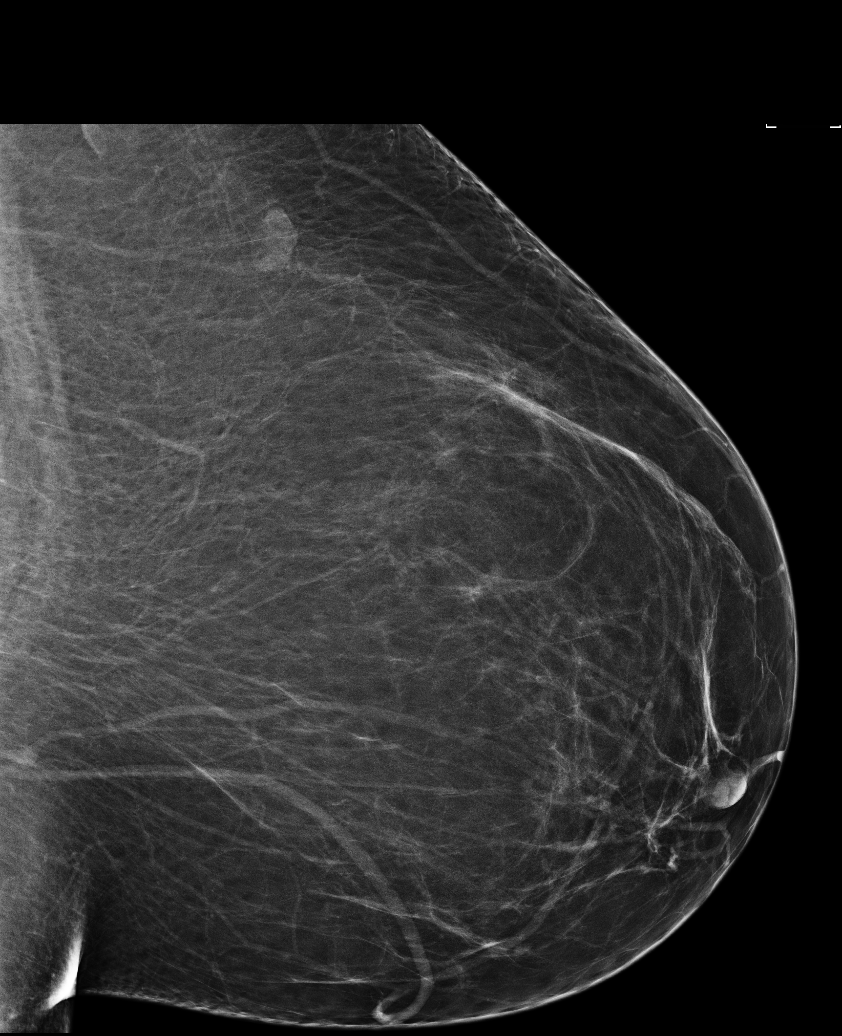

[L CC]
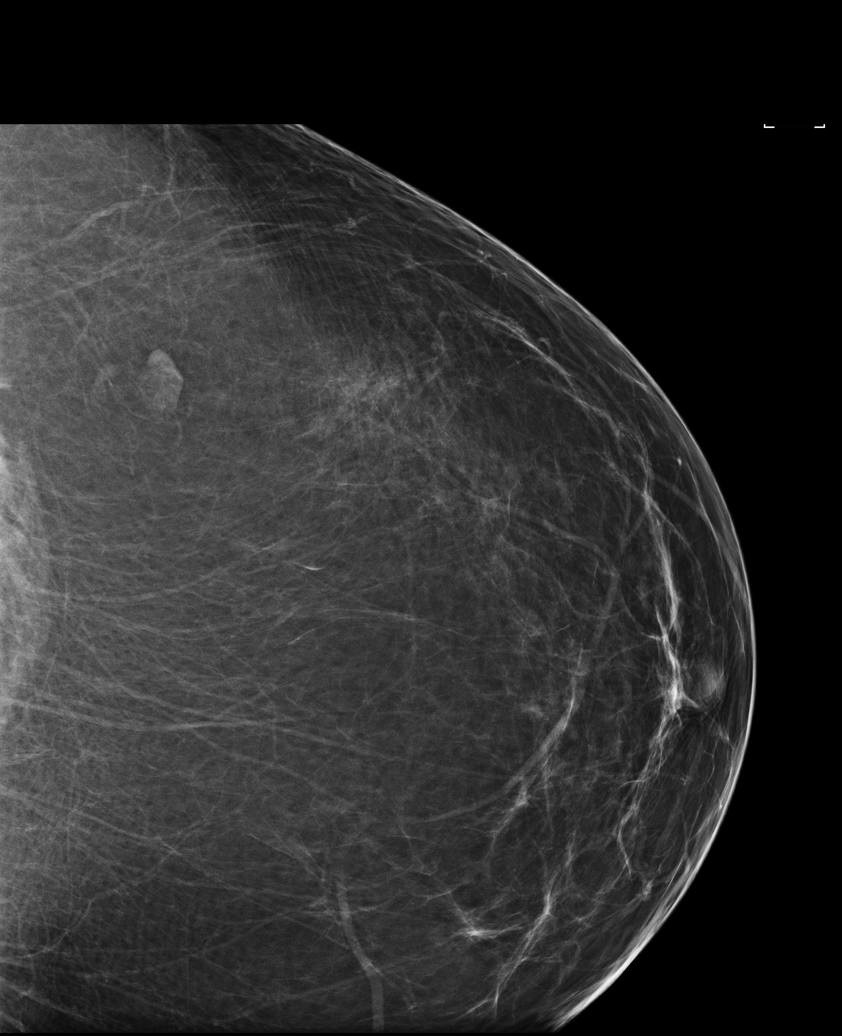

[L MLO synth-2D]
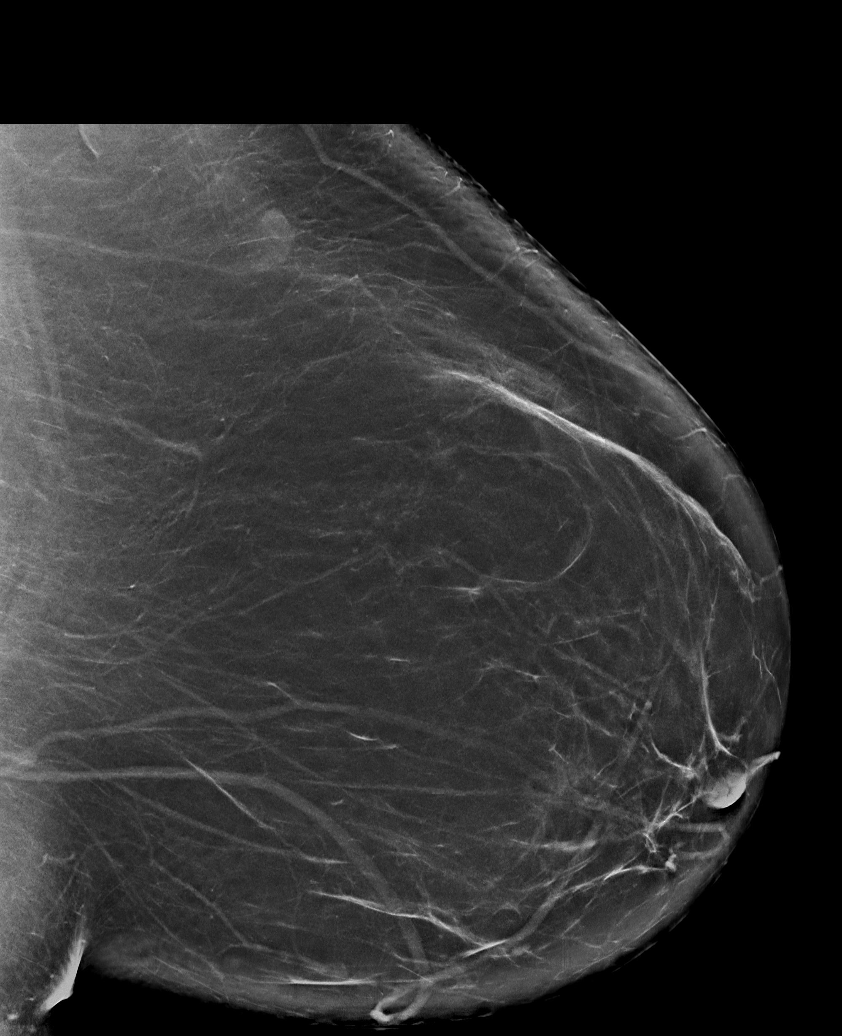

[L CC synth-2D]
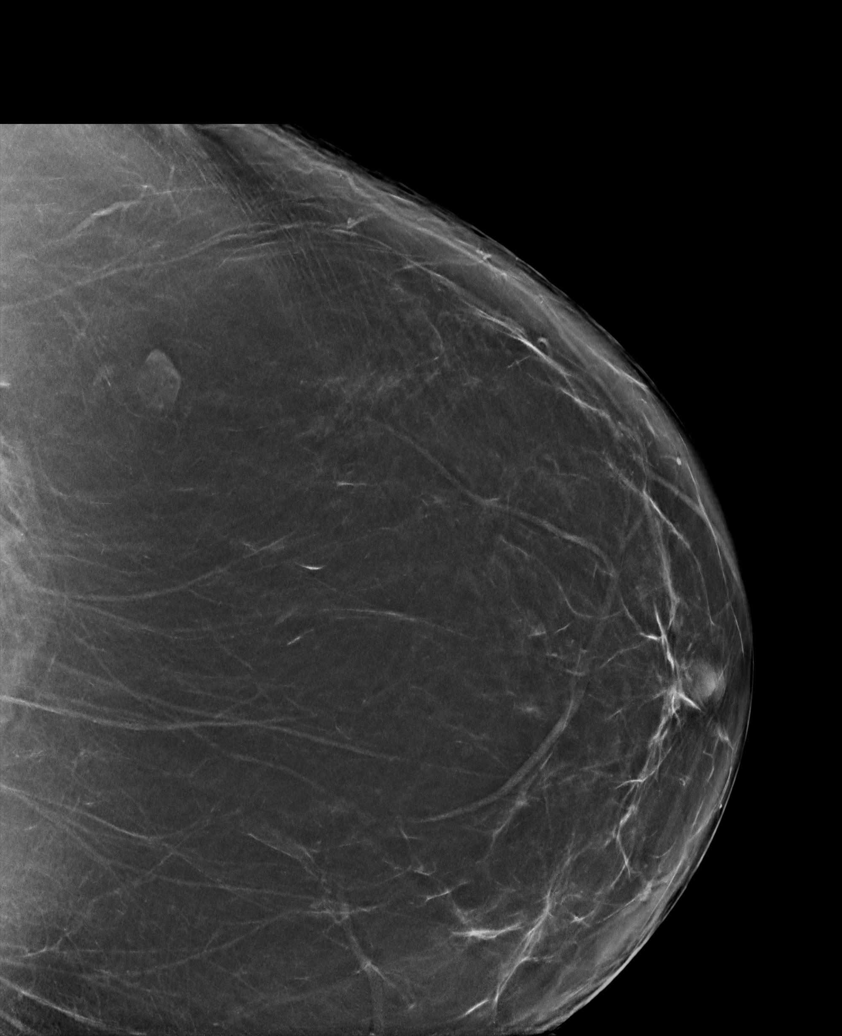

[L CC tomo · tomo slice 49/97.0]
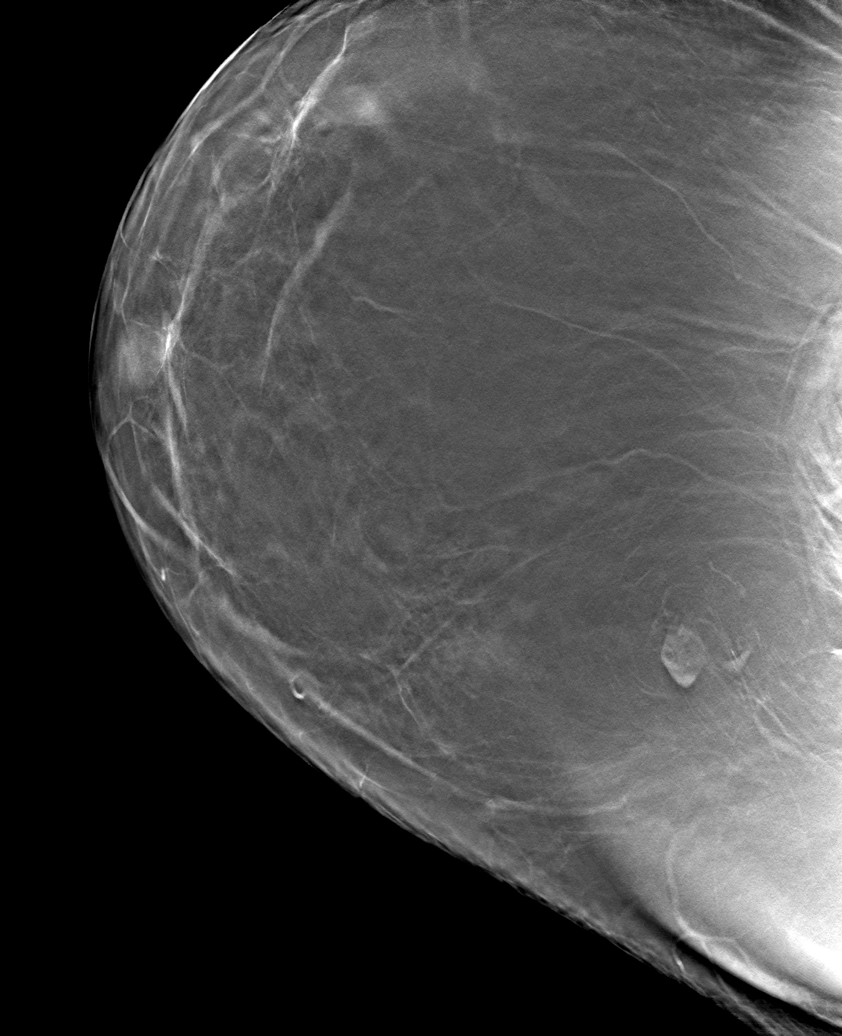

[L MLO tomo · tomo slice 51/101.0]
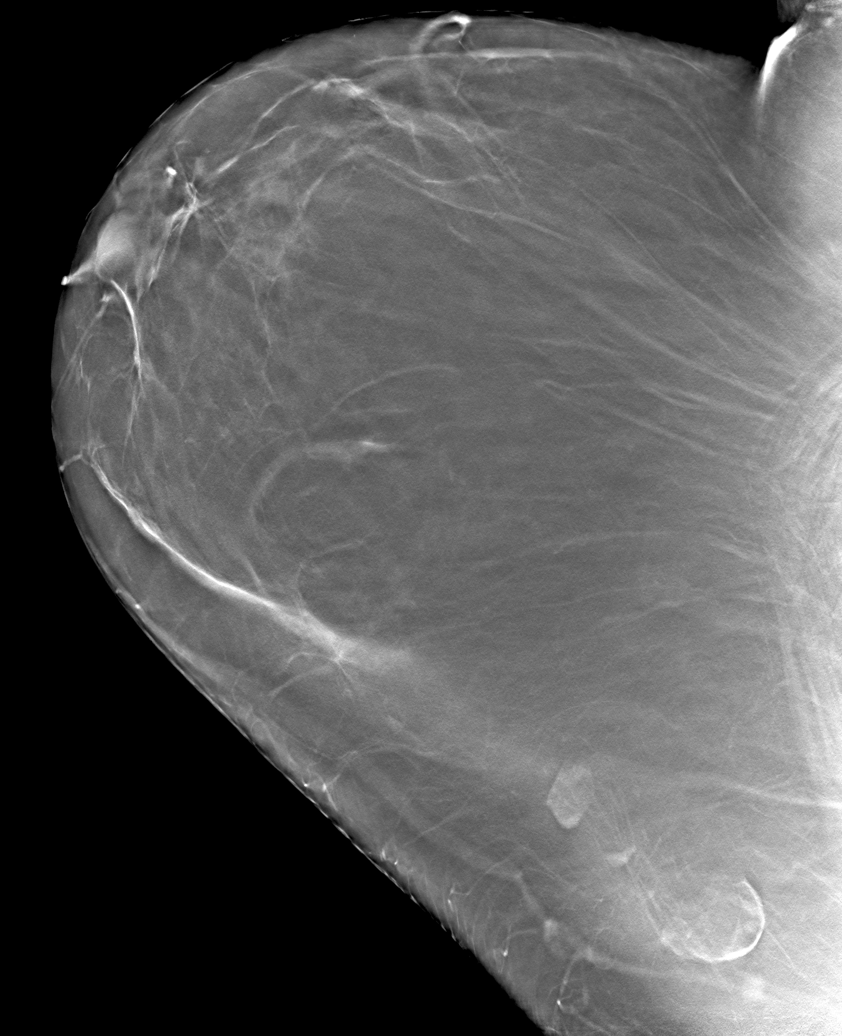

[6 of 14 positions shown; findings below may reference images not displayed]

ACR Breast Density Category b: There are scattered areas of
fibroglandular density.
FINDINGS: Additional imaging of the left breast was performed. Tomographic
images show there is a 1.4 cm intramammary lymph node in the
upper-outer quadrant of the left breast. The lesion is reniform in
shape with a thin cortex and central fat. It has a benign
appearance. There are no malignant type microcalcifications.

Mammographic images were processed with CAD.
IMPRESSION: No evidence of malignancy in the left breast.

RECOMMENDATION:
Bilateral screening mammogram in 1 year is recommended.

I have discussed the findings and recommendations with the patient.
Results were also provided in writing at the conclusion of the
visit. If applicable, a reminder letter will be sent to the patient
regarding the next appointment.

BI-RADS CATEGORY  2: Benign.

## 2018-11-19 ENCOUNTER — Other Ambulatory Visit: Payer: Self-pay | Admitting: Pediatrics

## 2018-11-19 DIAGNOSIS — Z1231 Encounter for screening mammogram for malignant neoplasm of breast: Secondary | ICD-10-CM

## 2018-11-26 ENCOUNTER — Inpatient Hospital Stay: Admission: RE | Admit: 2018-11-26 | Payer: BC Managed Care – PPO | Source: Ambulatory Visit

## 2018-12-02 ENCOUNTER — Ambulatory Visit: Payer: BC Managed Care – PPO

## 2023-12-25 ENCOUNTER — Other Ambulatory Visit: Payer: Self-pay | Admitting: Family Medicine

## 2023-12-25 DIAGNOSIS — R040 Epistaxis: Secondary | ICD-10-CM

## 2023-12-25 DIAGNOSIS — J329 Chronic sinusitis, unspecified: Secondary | ICD-10-CM

## 2024-01-01 ENCOUNTER — Ambulatory Visit
Admission: RE | Admit: 2024-01-01 | Discharge: 2024-01-01 | Disposition: A | Discharge status: Home / Self Care | Payer: Self-pay | Source: Ambulatory Visit | Attending: Family Medicine | Admitting: Family Medicine

## 2024-01-01 DIAGNOSIS — R040 Epistaxis: Secondary | ICD-10-CM | POA: Insufficient documentation

## 2024-01-01 DIAGNOSIS — J329 Chronic sinusitis, unspecified: Secondary | ICD-10-CM | POA: Diagnosis present
